# Patient Record
Sex: Female | Born: 1935 | Race: White | Hispanic: No | Marital: Married | Smoking: Never smoker
Health system: Southern US, Community
[De-identification: ages and names within clinical notes are randomized; demographics above are authoritative.]

## PROBLEM LIST (undated history)

## (undated) DIAGNOSIS — J849 Interstitial pulmonary disease, unspecified: Secondary | ICD-10-CM

## (undated) DIAGNOSIS — C801 Malignant (primary) neoplasm, unspecified: Secondary | ICD-10-CM

## (undated) DIAGNOSIS — K8020 Calculus of gallbladder without cholecystitis without obstruction: Secondary | ICD-10-CM

## (undated) DIAGNOSIS — G4700 Insomnia, unspecified: Secondary | ICD-10-CM

## (undated) DIAGNOSIS — N289 Disorder of kidney and ureter, unspecified: Secondary | ICD-10-CM

## (undated) DIAGNOSIS — J449 Chronic obstructive pulmonary disease, unspecified: Secondary | ICD-10-CM

## (undated) DIAGNOSIS — E559 Vitamin D deficiency, unspecified: Secondary | ICD-10-CM

## (undated) DIAGNOSIS — Z77090 Contact with and (suspected) exposure to asbestos: Secondary | ICD-10-CM

## (undated) DIAGNOSIS — I4510 Unspecified right bundle-branch block: Secondary | ICD-10-CM

## (undated) DIAGNOSIS — I491 Atrial premature depolarization: Secondary | ICD-10-CM

## (undated) DIAGNOSIS — I10 Essential (primary) hypertension: Secondary | ICD-10-CM

## (undated) DIAGNOSIS — E079 Disorder of thyroid, unspecified: Secondary | ICD-10-CM

## (undated) DIAGNOSIS — M8580 Other specified disorders of bone density and structure, unspecified site: Secondary | ICD-10-CM

## (undated) DIAGNOSIS — I639 Cerebral infarction, unspecified: Secondary | ICD-10-CM

## (undated) DIAGNOSIS — E119 Type 2 diabetes mellitus without complications: Secondary | ICD-10-CM

## (undated) HISTORY — PX: KNEE SURGERY: SHX244

## (undated) HISTORY — PX: CORONARY ANGIOPLASTY WITH STENT PLACEMENT: SHX49

## (undated) HISTORY — PX: VEIN SURGERY: SHX48

## (undated) HISTORY — PX: BREAST SURGERY: SHX581

---

## 1999-06-09 ENCOUNTER — Encounter: Payer: Self-pay | Admitting: Orthopedic Surgery

## 1999-06-13 ENCOUNTER — Inpatient Hospital Stay (HOSPITAL_COMMUNITY): Admission: RE | Admit: 1999-06-13 | Discharge: 1999-06-17 | Payer: Self-pay | Admitting: Orthopedic Surgery

## 1999-06-17 ENCOUNTER — Inpatient Hospital Stay (HOSPITAL_COMMUNITY)
Admission: RE | Admit: 1999-06-17 | Discharge: 1999-06-22 | Payer: Self-pay | Admitting: Physical Medicine and Rehabilitation

## 1999-06-23 ENCOUNTER — Encounter
Admission: RE | Admit: 1999-06-23 | Discharge: 1999-07-18 | Payer: Self-pay | Admitting: Physical Medicine and Rehabilitation

## 1999-07-19 ENCOUNTER — Encounter
Admission: RE | Admit: 1999-07-19 | Discharge: 1999-10-04 | Payer: Self-pay | Admitting: Physical Medicine and Rehabilitation

## 1999-08-26 ENCOUNTER — Ambulatory Visit (HOSPITAL_BASED_OUTPATIENT_CLINIC_OR_DEPARTMENT_OTHER): Admission: RE | Admit: 1999-08-26 | Discharge: 1999-08-26 | Payer: Self-pay | Admitting: Orthopedic Surgery

## 2004-10-19 ENCOUNTER — Ambulatory Visit: Payer: Self-pay | Admitting: Family Medicine

## 2004-10-19 ENCOUNTER — Other Ambulatory Visit: Admission: RE | Admit: 2004-10-19 | Discharge: 2004-10-19 | Payer: Self-pay | Admitting: Family Medicine

## 2005-03-21 ENCOUNTER — Ambulatory Visit: Payer: Self-pay | Admitting: Family Medicine

## 2010-04-04 ENCOUNTER — Encounter (INDEPENDENT_AMBULATORY_CARE_PROVIDER_SITE_OTHER): Payer: Self-pay | Admitting: *Deleted

## 2010-12-20 ENCOUNTER — Telehealth: Payer: Self-pay | Admitting: Gastroenterology

## 2011-01-17 NOTE — Letter (Signed)
Summary: Colonoscopy Letter  [facility]  [address] [place], [place] [address]   Phone: [phone]  Fax: [phone]      April 04, 2010 MRN: [id number]   [facility] [address] [place], [place]  [address]   Dear Ms. [facility],   According to your medical record, it is time for you to schedule a Colonoscopy. The American Cancer Society recommends this procedure as a method to detect early colon cancer. Patients with a family history of colon cancer, or a personal history of colon polyps or inflammatory bowel disease are at increased risk.  This letter has beeen generated based on the recommendations made at the time of your procedure. If you feel that in your particular situation this may no longer apply, please contact our office.  Please call our office at [phone] to schedule this appointment or to update your records at your earliest convenience.  Thank you for cooperating with [place] to provide you with the very best care possible.   Sincerely,  Barbette Hair. Arlyce Dice, M.D.  [facility] [phone]

## 2011-01-19 NOTE — Progress Notes (Signed)
Summary: Schedule Office Visit to Discuss Colonoscopy  Phone Note Outgoing Call Call back at [facility] Phone [phone]   Call placed by: Harlow Mares CMA Duncan Dull),  December 20, 2010 12:18 PM Call placed to: Patient Summary of Call: patient due for a colonoscopy said she never got the recall letter. She will need an office visit since she is 89. She states that she is going to see her primary care MD and will discuss this with him when she goes and call back to schedule. She states that she has our number.  Initial call taken by: Harlow Mares CMA (AAMA),  December 20, 2010 12:18 PM

## 2018-03-10 ENCOUNTER — Inpatient Hospital Stay (HOSPITAL_BASED_OUTPATIENT_CLINIC_OR_DEPARTMENT_OTHER)
Admission: EM | Admit: 2018-03-10 | Discharge: 2018-03-18 | DRG: 207 | Disposition: E | Payer: Medicare HMO | Attending: Pulmonary Disease | Admitting: Pulmonary Disease

## 2018-03-10 ENCOUNTER — Emergency Department (HOSPITAL_BASED_OUTPATIENT_CLINIC_OR_DEPARTMENT_OTHER): Payer: Medicare HMO

## 2018-03-10 ENCOUNTER — Other Ambulatory Visit: Payer: Self-pay

## 2018-03-10 ENCOUNTER — Encounter (HOSPITAL_BASED_OUTPATIENT_CLINIC_OR_DEPARTMENT_OTHER): Payer: Self-pay | Admitting: Emergency Medicine

## 2018-03-10 DIAGNOSIS — J449 Chronic obstructive pulmonary disease, unspecified: Secondary | ICD-10-CM | POA: Diagnosis present

## 2018-03-10 DIAGNOSIS — M8580 Other specified disorders of bone density and structure, unspecified site: Secondary | ICD-10-CM | POA: Diagnosis present

## 2018-03-10 DIAGNOSIS — J9602 Acute respiratory failure with hypercapnia: Secondary | ICD-10-CM | POA: Diagnosis not present

## 2018-03-10 DIAGNOSIS — Z8673 Personal history of transient ischemic attack (TIA), and cerebral infarction without residual deficits: Secondary | ICD-10-CM

## 2018-03-10 DIAGNOSIS — Z9289 Personal history of other medical treatment: Secondary | ICD-10-CM

## 2018-03-10 DIAGNOSIS — Z7951 Long term (current) use of inhaled steroids: Secondary | ICD-10-CM

## 2018-03-10 DIAGNOSIS — Z4659 Encounter for fitting and adjustment of other gastrointestinal appliance and device: Secondary | ICD-10-CM

## 2018-03-10 DIAGNOSIS — Z0189 Encounter for other specified special examinations: Secondary | ICD-10-CM

## 2018-03-10 DIAGNOSIS — Z66 Do not resuscitate: Secondary | ICD-10-CM | POA: Diagnosis not present

## 2018-03-10 DIAGNOSIS — I129 Hypertensive chronic kidney disease with stage 1 through stage 4 chronic kidney disease, or unspecified chronic kidney disease: Secondary | ICD-10-CM | POA: Diagnosis present

## 2018-03-10 DIAGNOSIS — E039 Hypothyroidism, unspecified: Secondary | ICD-10-CM | POA: Diagnosis present

## 2018-03-10 DIAGNOSIS — Z515 Encounter for palliative care: Secondary | ICD-10-CM | POA: Diagnosis not present

## 2018-03-10 DIAGNOSIS — I4510 Unspecified right bundle-branch block: Secondary | ICD-10-CM | POA: Diagnosis present

## 2018-03-10 DIAGNOSIS — R0602 Shortness of breath: Secondary | ICD-10-CM | POA: Diagnosis not present

## 2018-03-10 DIAGNOSIS — N183 Chronic kidney disease, stage 3 (moderate): Secondary | ICD-10-CM | POA: Diagnosis present

## 2018-03-10 DIAGNOSIS — I252 Old myocardial infarction: Secondary | ICD-10-CM

## 2018-03-10 DIAGNOSIS — I2510 Atherosclerotic heart disease of native coronary artery without angina pectoris: Secondary | ICD-10-CM

## 2018-03-10 DIAGNOSIS — Z888 Allergy status to other drugs, medicaments and biological substances status: Secondary | ICD-10-CM

## 2018-03-10 DIAGNOSIS — R0902 Hypoxemia: Secondary | ICD-10-CM | POA: Diagnosis present

## 2018-03-10 DIAGNOSIS — G8929 Other chronic pain: Secondary | ICD-10-CM | POA: Diagnosis present

## 2018-03-10 DIAGNOSIS — J181 Lobar pneumonia, unspecified organism: Secondary | ICD-10-CM | POA: Diagnosis not present

## 2018-03-10 DIAGNOSIS — I10 Essential (primary) hypertension: Secondary | ICD-10-CM | POA: Diagnosis present

## 2018-03-10 DIAGNOSIS — R0603 Acute respiratory distress: Secondary | ICD-10-CM

## 2018-03-10 DIAGNOSIS — J189 Pneumonia, unspecified organism: Secondary | ICD-10-CM

## 2018-03-10 DIAGNOSIS — Z9861 Coronary angioplasty status: Secondary | ICD-10-CM

## 2018-03-10 DIAGNOSIS — Z7982 Long term (current) use of aspirin: Secondary | ICD-10-CM

## 2018-03-10 DIAGNOSIS — Z7902 Long term (current) use of antithrombotics/antiplatelets: Secondary | ICD-10-CM

## 2018-03-10 DIAGNOSIS — E872 Acidosis: Secondary | ICD-10-CM | POA: Diagnosis present

## 2018-03-10 DIAGNOSIS — E1122 Type 2 diabetes mellitus with diabetic chronic kidney disease: Secondary | ICD-10-CM | POA: Diagnosis present

## 2018-03-10 DIAGNOSIS — G9341 Metabolic encephalopathy: Secondary | ICD-10-CM | POA: Diagnosis not present

## 2018-03-10 DIAGNOSIS — E559 Vitamin D deficiency, unspecified: Secondary | ICD-10-CM | POA: Diagnosis present

## 2018-03-10 DIAGNOSIS — Z77090 Contact with and (suspected) exposure to asbestos: Secondary | ICD-10-CM | POA: Diagnosis present

## 2018-03-10 DIAGNOSIS — J9601 Acute respiratory failure with hypoxia: Secondary | ICD-10-CM

## 2018-03-10 DIAGNOSIS — Z781 Physical restraint status: Secondary | ICD-10-CM

## 2018-03-10 DIAGNOSIS — D649 Anemia, unspecified: Secondary | ICD-10-CM | POA: Diagnosis present

## 2018-03-10 DIAGNOSIS — J849 Interstitial pulmonary disease, unspecified: Secondary | ICD-10-CM | POA: Diagnosis present

## 2018-03-10 DIAGNOSIS — M25552 Pain in left hip: Secondary | ICD-10-CM

## 2018-03-10 DIAGNOSIS — Z955 Presence of coronary angioplasty implant and graft: Secondary | ICD-10-CM

## 2018-03-10 DIAGNOSIS — Z885 Allergy status to narcotic agent status: Secondary | ICD-10-CM

## 2018-03-10 HISTORY — DX: Essential (primary) hypertension: I10

## 2018-03-10 HISTORY — DX: Calculus of gallbladder without cholecystitis without obstruction: K80.20

## 2018-03-10 HISTORY — DX: Chronic obstructive pulmonary disease, unspecified: J44.9

## 2018-03-10 HISTORY — DX: Other specified disorders of bone density and structure, unspecified site: M85.80

## 2018-03-10 HISTORY — DX: Interstitial pulmonary disease, unspecified: J84.9

## 2018-03-10 HISTORY — DX: Disorder of kidney and ureter, unspecified: N28.9

## 2018-03-10 HISTORY — DX: Type 2 diabetes mellitus without complications: E11.9

## 2018-03-10 HISTORY — DX: Cerebral infarction, unspecified: I63.9

## 2018-03-10 HISTORY — DX: Vitamin D deficiency, unspecified: E55.9

## 2018-03-10 HISTORY — DX: Atrial premature depolarization: I49.1

## 2018-03-10 HISTORY — DX: Disorder of thyroid, unspecified: E07.9

## 2018-03-10 HISTORY — DX: Malignant (primary) neoplasm, unspecified: C80.1

## 2018-03-10 HISTORY — DX: Contact with and (suspected) exposure to asbestos: Z77.090

## 2018-03-10 HISTORY — DX: Insomnia, unspecified: G47.00

## 2018-03-10 HISTORY — DX: Unspecified right bundle-branch block: I45.10

## 2018-03-10 LAB — CBC WITH DIFFERENTIAL/PLATELET
BASOS ABS: 0 10*3/uL (ref 0.0–0.1)
BASOS PCT: 0 %
EOS ABS: 0.4 10*3/uL (ref 0.0–0.7)
EOS PCT: 4 %
HEMATOCRIT: 26.8 % — AB (ref 36.0–46.0)
Hemoglobin: 7.8 g/dL — ABNORMAL LOW (ref 12.0–15.0)
Lymphocytes Relative: 18 %
Lymphs Abs: 1.5 10*3/uL (ref 0.7–4.0)
MCH: 26.7 pg (ref 26.0–34.0)
MCHC: 29.1 g/dL — AB (ref 30.0–36.0)
MCV: 91.8 fL (ref 78.0–100.0)
MONO ABS: 1.1 10*3/uL — AB (ref 0.1–1.0)
MONOS PCT: 12 %
NEUTROS ABS: 5.6 10*3/uL (ref 1.7–7.7)
Neutrophils Relative %: 66 %
PLATELETS: 287 10*3/uL (ref 150–400)
RBC: 2.92 MIL/uL — ABNORMAL LOW (ref 3.87–5.11)
RDW: 17.3 % — AB (ref 11.5–15.5)
WBC: 8.6 10*3/uL (ref 4.0–10.5)

## 2018-03-10 LAB — COMPREHENSIVE METABOLIC PANEL
ALBUMIN: 3.4 g/dL — AB (ref 3.5–5.0)
ALK PHOS: 56 U/L (ref 38–126)
ALT: 17 U/L (ref 14–54)
AST: 30 U/L (ref 15–41)
Anion gap: 10 (ref 5–15)
BILIRUBIN TOTAL: 0.4 mg/dL (ref 0.3–1.2)
BUN: 32 mg/dL — AB (ref 6–20)
CALCIUM: 8.3 mg/dL — AB (ref 8.9–10.3)
CO2: 27 mmol/L (ref 22–32)
CREATININE: 1.09 mg/dL — AB (ref 0.44–1.00)
Chloride: 102 mmol/L (ref 101–111)
GFR calc Af Amer: 51 mL/min — ABNORMAL LOW (ref >60)
GFR calc non Af Amer: 44 mL/min — ABNORMAL LOW (ref >60)
GLUCOSE: 113 mg/dL — AB (ref 65–99)
Potassium: 4.1 mmol/L (ref 3.5–5.1)
SODIUM: 139 mmol/L (ref 135–145)
TOTAL PROTEIN: 7.5 g/dL (ref 6.5–8.1)

## 2018-03-10 LAB — OCCULT BLOOD X 1 CARD TO LAB, STOOL: FECAL OCCULT BLD: NEGATIVE

## 2018-03-10 LAB — TROPONIN I: Troponin I: <0.03 ng/mL (ref <0.03)

## 2018-03-10 MED ORDER — FENTANYL CITRATE (PF) 100 MCG/2ML IJ SOLN
50.0000 ug | Freq: Once | INTRAMUSCULAR | Status: AC
  Administered 2018-03-10: 50 ug via INTRAVENOUS
  Filled 2018-03-10: qty 2

## 2018-03-10 MED ORDER — IPRATROPIUM-ALBUTEROL 0.5-2.5 (3) MG/3ML IN SOLN
3.0000 mL | Freq: Once | RESPIRATORY_TRACT | Status: AC
  Administered 2018-03-10: 3 mL via RESPIRATORY_TRACT
  Filled 2018-03-10: qty 3

## 2018-03-10 MED ORDER — DIAZEPAM 5 MG/ML IJ SOLN
2.5000 mg | Freq: Once | INTRAMUSCULAR | Status: AC
Start: 2018-03-11 — End: 2018-03-11
  Administered 2018-03-11: 2.5 mg via INTRAVENOUS
  Filled 2018-03-10: qty 2

## 2018-03-10 MED ORDER — ALBUTEROL SULFATE (2.5 MG/3ML) 0.083% IN NEBU
2.5000 mg | INHALATION_SOLUTION | Freq: Once | RESPIRATORY_TRACT | Status: AC
  Administered 2018-03-10: 2.5 mg via RESPIRATORY_TRACT
  Filled 2018-03-10: qty 3

## 2018-03-10 NOTE — ED Notes (Signed)
ED Provider at bedside speaking with family. Pt remains in xray

## 2018-03-10 NOTE — ED Notes (Signed)
ED Provider at bedside. Dr. Long 

## 2018-03-10 NOTE — ED Provider Notes (Signed)
Emergency Department Provider Note   I have reviewed the triage vital signs and the nursing notes.   HISTORY  Chief Complaint Shortness of Breath   HPI Melissa Briggs is a 84 y.o. female with PMH of COPD, HTN, prior CVA, CKD, and interstitial lung disease on 2L [place] presents to the emergency department for evaluation of worsening lower extremity swelling and chronic left hip pain.  The patient states that her difficulty breathing is near her baseline and is not the reason she presented initially.  She denies any chest pain.  In speaking with her husband at bedside and daughter by phone the patient has had chronic difficulty breathing over the last year and they agree it is not significantly worse from baseline.  They note worsening swelling in the legs and pain in the left hip.  She has followed with her primary care physician and apparently had x-rays of the hip with no acute findings.  Approximately 10 days ago she saw her primary care physician who started her on gabapentin but she had side effects and did not tolerate the medication.  The daughter, by phone, states that she recently lived with the patient and that she is frequently up at night and sleeping during the day.  She notes memory issues and thinks many of these problems are coming from lack of sleep.    Past Medical History:  Diagnosis Date  . Asbestos exposure   . Atrial premature depolarization   . Cancer ([place])   . COPD (chronic obstructive pulmonary disease) ([place])   . Diabetes mellitus without complication ([place])   . Gallstones   . Hypertension    essential  . Insomnia   . Interstitial lung disease ([place])   . Osteopenia   . RBBB   . Renal disorder    stage 3 kidney disease  . Stroke ([place])    tia  . Thyroid disease    acquired hypothyroidism  . Vitamin D deficiency     There are no active problems to display for this patient.   Past Surgical History:  Procedure Laterality Date  . BREAST SURGERY    .  CORONARY ANGIOPLASTY WITH STENT PLACEMENT    . KNEE SURGERY    . VEIN SURGERY      Current Outpatient Rx  . Order #: [id number] Class: Historical Med  . Order #: [id number] Class: Historical Med  . Order #: [id number] Class: Historical Med  . Order #: [id number] Class: Historical Med  . Order #: [id number] Class: Historical Med  . Order #: [id number] Class: Historical Med  . Order #: [id number] Class: Historical Med    Allergies Celebrex [celecoxib]; Codeine; and Pravachol [pravastatin sodium]  History reviewed. No pertinent family history.  Social History Social History   Tobacco Use  . Smoking status: Never Smoker  . Smokeless tobacco: Never Used  Substance Use Topics  . Alcohol use: Not Currently  . Drug use: Not Currently    Review of Systems  Constitutional: No fever/chills. Positive generalized weakness.  Eyes: No visual changes. ENT: No sore throat. Cardiovascular: Denies chest pain. Positive LE edema.  Respiratory: Positive shortness of breath. Gastrointestinal: No abdominal pain.  No nausea, no vomiting.  No diarrhea.  No constipation. Genitourinary: Negative for dysuria. Musculoskeletal: Negative for back pain. Positive left hip pain.  Skin: Negative for rash. Neurological: Negative for headaches, focal weakness or numbness.  10-point ROS otherwise negative.  ____________________________________________   PHYSICAL EXAM:  VITAL SIGNS: ED Triage Vitals  Enc Vitals Group  BP 02/23/2018 2202 (!) 147/82     Pulse Rate 02/16/2018 2202 76     Resp 03/15/2018 2202 18     Temp 03/16/2018 2202 98.6 F (37 C)     Temp Source 02/25/2018 2202 Oral     SpO2 03/03/2018 2202 96 %     Weight 03/13/2018 2152 195 lb (88.5 kg)     Height 02/17/2018 2152 5\' 3"  (1.6 m)     Pain Score 02/24/2018 2151 0   Constitutional: Alert with rapid breathing. Appears to be in moderate distress.  Eyes: Conjunctivae are normal. Head: Atraumatic. Nose: No congestion/rhinnorhea. Mouth/Throat: Mucous membranes are  slightly dry.  Neck: No stridor.   Cardiovascular: Normal rate, regular rhythm. Good peripheral circulation. Grossly normal heart sounds.   Respiratory: Increased respiratory effort.  No retractions. Lungs diminished at the bases and with end-expiratory wheezing.  Gastrointestinal: Soft and nontender. No distention. Brown stool. No BRB or melena.  Musculoskeletal: Bilateral LE edema that is pitting and above the knee bilaterally. Chronic appearing skin redness in both legs.  Neurologic:  Normal speech and language. No gross focal neurologic deficits are appreciated.  Skin:  Skin is warm and dry.   ____________________________________________   LABS (all labs ordered are listed, but only abnormal results are displayed)  Labs Reviewed  COMPREHENSIVE METABOLIC PANEL - Abnormal; Notable for the following components:      Result Value   Glucose, Bld 113 (*)    BUN 32 (*)    Creatinine, Ser 1.09 (*)    Calcium 8.3 (*)    Albumin 3.4 (*)    GFR calc non Af Amer 44 (*)    GFR calc Af Amer 51 (*)    All other components within normal limits  BRAIN NATRIURETIC PEPTIDE - Abnormal; Notable for the following components:   B Natriuretic Peptide 195.3 (*)    All other components within normal limits  CBC WITH DIFFERENTIAL/PLATELET - Abnormal; Notable for the following components:   RBC 2.92 (*)    Hemoglobin 7.8 (*)    HCT 26.8 (*)    MCHC 29.1 (*)    RDW 17.3 (*)    Monocytes Absolute 1.1 (*)    All other components within normal limits  URINE CULTURE  TROPONIN I  OCCULT BLOOD X 1 CARD TO LAB, STOOL  URINALYSIS, ROUTINE W REFLEX MICROSCOPIC   ____________________________________________  EKG   EKG Interpretation  Date/Time:  Sunday March 10 2018 21:59:36 EDT Ventricular Rate:  82 PR Interval:  172 QRS Duration: 122 QT Interval:  410 QTC Calculation: 479 R Axis:   39 Text Interpretation:  Normal sinus rhythm Right bundle branch block Abnormal ECG No STEMI.  No recent EKG for  comparison.  Confirmed by Nanda Quinton [phone]) on 03/09/2018 10:17:58 PM       ____________________________________________  RADIOLOGY  Dg Chest 2 View  Result Date: 03/11/2018 CLINICAL DATA:  84 year old female with shortness of breath for 2 days. Lower extremity swelling. EXAM: CHEST - 2 VIEW COMPARISON:  01/04/2018 and earlier. FINDINGS: Chronic exaggerated thoracic kyphosis and increased AP dimension to the chest. Stable cardiomegaly and mediastinal contours. Calcified aortic atherosclerosis. No pneumothorax, pulmonary edema, pleural effusion or acute pulmonary opacity identified. Osteopenia. No acute osseous abnormality identified. Negative visible bowel gas pattern. Chronic postoperative changes to the right chest wall/axilla. IMPRESSION: 1.  No acute cardiopulmonary abnormality. 2. Chronic cardiomegaly, Calcified aortic atherosclerosis. Electronically Signed   By: Genevie Ann M.D.   On: 03/11/2018 00:04   Dg Hip  Unilat W Or Wo Pelvis 2-3 Views Left  Result Date: 03/11/2018 CLINICAL DATA:  Bilateral lower extremity swelling and hip pain EXAM: DG HIP (WITH OR WITHOUT PELVIS) 2-3V LEFT COMPARISON:  None. FINDINGS: There is no evidence of hip fracture or dislocation. There is no evidence of arthropathy or other focal bone abnormality. IMPRESSION: Negative. Electronically Signed   By: Ulyses Jarred M.D.   On: 03/11/2018 00:08    ____________________________________________   PROCEDURES  Procedure(s) performed:   .Critical Care Performed by: Margette Fast, MD Authorized by: Margette Fast, MD   Critical care provider statement:    Critical care time (minutes):  35   Critical care time was exclusive of:  Separately billable procedures and treating other patients and teaching time   Critical care was necessary to treat or prevent imminent or life-threatening deterioration of the following conditions:  Respiratory failure and circulatory failure   Critical care was time spent personally by  me on the following activities:  Blood draw for specimens, development of treatment plan with patient or surrogate, evaluation of patient's response to treatment, examination of patient, obtaining history from patient or surrogate, ordering and performing treatments and interventions, ordering and review of laboratory studies, ordering and review of radiographic studies, pulse oximetry, re-evaluation of patient's condition and review of old charts   I assumed direction of critical care for this patient from another provider in my specialty: no      ____________________________________________   INITIAL IMPRESSION / ASSESSMENT AND PLAN / ED COURSE  Pertinent labs & imaging results that were available during my care of the patient were reviewed by me and considered in my medical decision making (see chart for details).  Patient presents to the emergency department for evaluation of increasing lower extremity swelling over the past 2 days.  She denies worsening shortness of breath to me or chest pain.  Husband states that her shortness of breath seems about where it normally is.  She does have edematous lower extremities bilaterally with skin changes consistent with chronic lower extremity edema.  She is complaining of left hip pain.  Plan to obtain chest x-ray, hip x-ray, labs, and reassess. Neb given on arrival with some improvement in SOB.   Labs reviewed with worsening anemia. 7.8 today down from 12.2 on 01/01/2018 in [place]. Suspect that this is contributing to the patient's SOB and weakness at home. Plain films of the chest and hip are negative. Patient given Valium for spasm type pain. Called transfer line for admission to [facility] but no beds until at least 12 tomorrow with 15 waiting in the ED. Will admit to [facility] for transfusion, anemia w/u, and mgmt of peripheral edema.   Discussed patient's case with Hospitalist, Dr. Aggie Moats to request admission. Patient and family (if  present) updated with plan. Care transferred to [facility] service.  I reviewed all nursing notes, vitals, pertinent old records, EKGs, labs, imaging (as available).  ____________________________________________  FINAL CLINICAL IMPRESSION(S) / ED DIAGNOSES  Final diagnoses:  Symptomatic anemia  Left hip pain  SOB (shortness of breath)     MEDICATIONS GIVEN DURING THIS VISIT:  Medications  ipratropium-albuterol (DUONEB) 0.5-2.5 (3) MG/3ML nebulizer solution 3 mL (3 mLs Nebulization Given 03/09/2018 2223)  albuterol (PROVENTIL) (2.5 MG/3ML) 0.083% nebulizer solution 2.5 mg (2.5 mg Nebulization Given 02/20/2018 2223)  fentaNYL (SUBLIMAZE) injection 50 mcg (50 mcg Intravenous Given 02/17/2018 2343)  diazepam (VALIUM) injection 2.5 mg (2.5 mg Intravenous Given 03/11/18 0009)  0.9 %  sodium chloride infusion ( Intravenous New Bag/Given 03/11/18 0008)    Note:  This document was prepared using Dragon voice recognition software and may include unintentional dictation errors.  Nanda Quinton, MD Emergency Medicine    Estefany Goebel, [facility], MD 03/11/18 1026

## 2018-03-10 NOTE — ED Notes (Signed)
RT at bedside giving pt breathing tx

## 2018-03-10 NOTE — ED Notes (Signed)
Patient transported to X-ray 

## 2018-03-10 NOTE — ED Triage Notes (Signed)
Patient states that she had had increased swelling to her bilateral lower extremities worsening over the last 2 days - also increased SOB - patient is on home O2 at 2 liters

## 2018-03-10 NOTE — ED Notes (Signed)
ED Provider at bedside. 

## 2018-03-11 ENCOUNTER — Observation Stay (HOSPITAL_COMMUNITY): Payer: Medicare HMO

## 2018-03-11 ENCOUNTER — Inpatient Hospital Stay (HOSPITAL_COMMUNITY): Payer: Medicare HMO

## 2018-03-11 ENCOUNTER — Observation Stay (HOSPITAL_COMMUNITY)
Admit: 2018-03-11 | Discharge: 2018-03-11 | Disposition: A | Discharge status: Home / Self Care | Payer: Medicare HMO | Attending: Internal Medicine | Admitting: Internal Medicine

## 2018-03-11 DIAGNOSIS — E1122 Type 2 diabetes mellitus with diabetic chronic kidney disease: Secondary | ICD-10-CM | POA: Diagnosis present

## 2018-03-11 DIAGNOSIS — R0602 Shortness of breath: Secondary | ICD-10-CM | POA: Diagnosis present

## 2018-03-11 DIAGNOSIS — E559 Vitamin D deficiency, unspecified: Secondary | ICD-10-CM | POA: Diagnosis present

## 2018-03-11 DIAGNOSIS — I4510 Unspecified right bundle-branch block: Secondary | ICD-10-CM | POA: Diagnosis present

## 2018-03-11 DIAGNOSIS — A419 Sepsis, unspecified organism: Secondary | ICD-10-CM | POA: Diagnosis not present

## 2018-03-11 DIAGNOSIS — Z7189 Other specified counseling: Secondary | ICD-10-CM

## 2018-03-11 DIAGNOSIS — G9341 Metabolic encephalopathy: Secondary | ICD-10-CM | POA: Diagnosis not present

## 2018-03-11 DIAGNOSIS — N183 Chronic kidney disease, stage 3 (moderate): Secondary | ICD-10-CM | POA: Diagnosis present

## 2018-03-11 DIAGNOSIS — J9601 Acute respiratory failure with hypoxia: Secondary | ICD-10-CM

## 2018-03-11 DIAGNOSIS — I2510 Atherosclerotic heart disease of native coronary artery without angina pectoris: Secondary | ICD-10-CM | POA: Diagnosis present

## 2018-03-11 DIAGNOSIS — Z885 Allergy status to narcotic agent status: Secondary | ICD-10-CM | POA: Diagnosis not present

## 2018-03-11 DIAGNOSIS — Z515 Encounter for palliative care: Secondary | ICD-10-CM | POA: Diagnosis not present

## 2018-03-11 DIAGNOSIS — Z9861 Coronary angioplasty status: Secondary | ICD-10-CM | POA: Diagnosis not present

## 2018-03-11 DIAGNOSIS — G9340 Encephalopathy, unspecified: Secondary | ICD-10-CM | POA: Diagnosis not present

## 2018-03-11 DIAGNOSIS — R6521 Severe sepsis with septic shock: Secondary | ICD-10-CM | POA: Diagnosis not present

## 2018-03-11 DIAGNOSIS — J9602 Acute respiratory failure with hypercapnia: Secondary | ICD-10-CM | POA: Diagnosis not present

## 2018-03-11 DIAGNOSIS — J181 Lobar pneumonia, unspecified organism: Principal | ICD-10-CM

## 2018-03-11 DIAGNOSIS — I509 Heart failure, unspecified: Secondary | ICD-10-CM | POA: Diagnosis not present

## 2018-03-11 DIAGNOSIS — Z8673 Personal history of transient ischemic attack (TIA), and cerebral infarction without residual deficits: Secondary | ICD-10-CM | POA: Diagnosis not present

## 2018-03-11 DIAGNOSIS — J449 Chronic obstructive pulmonary disease, unspecified: Secondary | ICD-10-CM | POA: Diagnosis present

## 2018-03-11 DIAGNOSIS — I252 Old myocardial infarction: Secondary | ICD-10-CM | POA: Diagnosis not present

## 2018-03-11 DIAGNOSIS — M8580 Other specified disorders of bone density and structure, unspecified site: Secondary | ICD-10-CM | POA: Diagnosis present

## 2018-03-11 DIAGNOSIS — I10 Essential (primary) hypertension: Secondary | ICD-10-CM | POA: Diagnosis present

## 2018-03-11 DIAGNOSIS — E039 Hypothyroidism, unspecified: Secondary | ICD-10-CM | POA: Diagnosis present

## 2018-03-11 DIAGNOSIS — I129 Hypertensive chronic kidney disease with stage 1 through stage 4 chronic kidney disease, or unspecified chronic kidney disease: Secondary | ICD-10-CM | POA: Diagnosis present

## 2018-03-11 DIAGNOSIS — R0902 Hypoxemia: Secondary | ICD-10-CM | POA: Diagnosis present

## 2018-03-11 DIAGNOSIS — J849 Interstitial pulmonary disease, unspecified: Secondary | ICD-10-CM | POA: Diagnosis present

## 2018-03-11 DIAGNOSIS — E872 Acidosis: Secondary | ICD-10-CM | POA: Diagnosis present

## 2018-03-11 DIAGNOSIS — M25552 Pain in left hip: Secondary | ICD-10-CM | POA: Diagnosis present

## 2018-03-11 DIAGNOSIS — D649 Anemia, unspecified: Secondary | ICD-10-CM | POA: Diagnosis not present

## 2018-03-11 DIAGNOSIS — Z888 Allergy status to other drugs, medicaments and biological substances status: Secondary | ICD-10-CM | POA: Diagnosis not present

## 2018-03-11 DIAGNOSIS — Z955 Presence of coronary angioplasty implant and graft: Secondary | ICD-10-CM | POA: Diagnosis not present

## 2018-03-11 DIAGNOSIS — G8929 Other chronic pain: Secondary | ICD-10-CM | POA: Diagnosis present

## 2018-03-11 DIAGNOSIS — J189 Pneumonia, unspecified organism: Secondary | ICD-10-CM

## 2018-03-11 DIAGNOSIS — R609 Edema, unspecified: Secondary | ICD-10-CM | POA: Diagnosis not present

## 2018-03-11 DIAGNOSIS — Z77090 Contact with and (suspected) exposure to asbestos: Secondary | ICD-10-CM | POA: Diagnosis present

## 2018-03-11 LAB — BLOOD GAS, ARTERIAL
ACID-BASE EXCESS: 2.5 mmol/L — AB (ref 0.0–2.0)
Acid-base deficit: 0.5 mmol/L (ref 0.0–2.0)
Acid-base deficit: 0.3 mmol/L (ref 0.0–2.0)
BICARBONATE: 29.2 mmol/L — AB (ref 20.0–28.0)
BICARBONATE: 25.6 mmol/L (ref 20.0–28.0)
Bicarbonate: 28.7 mmol/L — ABNORMAL HIGH (ref 20.0–28.0)
DRAWN BY: 308601
DRAWN BY: 441261
Delivery systems: POSITIVE
Delivery systems: POSITIVE
Drawn by: 441261
Expiratory PAP: 5
Expiratory PAP: 5
FIO2: 75
FIO2: 50
FIO2: 100
INSPIRATORY PAP: 10
Inspiratory PAP: 15
LHR: 10 {breaths}/min
MECHVT: 420 mL
O2 SAT: 95.8 %
O2 Saturation: 96.6 %
O2 Saturation: 99.7 %
PCO2 ART: 81.6 mmHg — AB (ref 32.0–48.0)
PCO2 ART: 53.3 mmHg — AB (ref 32.0–48.0)
PEEP: 5 cmH2O
PH ART: 7.172 — AB (ref 7.350–7.450)
PO2 ART: 350 mmHg — AB (ref 83.0–108.0)
Patient temperature: 98.6
Patient temperature: 98.6
Patient temperature: 98.6
RATE: 18 resp/min
pCO2 arterial: 62.8 mmHg — ABNORMAL HIGH (ref 32.0–48.0)
pH, Arterial: 7.289 — ABNORMAL LOW (ref 7.350–7.450)
pH, Arterial: 7.303 — ABNORMAL LOW (ref 7.350–7.450)
pO2, Arterial: 110 mmHg — ABNORMAL HIGH (ref 83.0–108.0)
pO2, Arterial: 101 mmHg (ref 83.0–108.0)

## 2018-03-11 LAB — PROCALCITONIN: Procalcitonin: 0.1 ng/mL

## 2018-03-11 LAB — BASIC METABOLIC PANEL
Anion gap: 10 (ref 5–15)
BUN: 27 mg/dL — AB (ref 6–20)
CALCIUM: 8 mg/dL — AB (ref 8.9–10.3)
CHLORIDE: 102 mmol/L (ref 101–111)
CO2: 26 mmol/L (ref 22–32)
Creatinine, Ser: 0.8 mg/dL (ref 0.44–1.00)
GFR calc non Af Amer: >60 mL/min (ref >60)
Glucose, Bld: 142 mg/dL — ABNORMAL HIGH (ref 65–99)
Potassium: 4.3 mmol/L (ref 3.5–5.1)
SODIUM: 138 mmol/L (ref 135–145)

## 2018-03-11 LAB — TROPONIN I
TROPONIN I: 0.41 ng/mL — AB (ref <0.03)
Troponin I: 0.92 ng/mL (ref <0.03)

## 2018-03-11 LAB — URINALYSIS, ROUTINE W REFLEX MICROSCOPIC
BACTERIA UA: NONE SEEN
Bilirubin Urine: NEGATIVE
Glucose, UA: NEGATIVE mg/dL
Hgb urine dipstick: NEGATIVE
KETONES UR: NEGATIVE mg/dL
Nitrite: NEGATIVE
PROTEIN: NEGATIVE mg/dL
Specific Gravity, Urine: 1.017 (ref 1.005–1.030)
pH: 5 (ref 5.0–8.0)

## 2018-03-11 LAB — RETICULOCYTES
RBC.: 2.78 MIL/uL — ABNORMAL LOW (ref 3.87–5.11)
Retic Count, Absolute: 61.2 10*3/uL (ref 19.0–186.0)
Retic Ct Pct: 2.2 % (ref 0.4–3.1)

## 2018-03-11 LAB — VITAMIN B12: VITAMIN B 12: 260 pg/mL (ref 180–914)

## 2018-03-11 LAB — ECHOCARDIOGRAM COMPLETE
HEIGHTINCHES: 63 in
WEIGHTICAEL: 3120 [oz_av]

## 2018-03-11 LAB — MRSA PCR SCREENING: MRSA BY PCR: NEGATIVE

## 2018-03-11 LAB — CBG MONITORING, ED: Glucose-Capillary: 112 mg/dL — ABNORMAL HIGH (ref 65–99)

## 2018-03-11 LAB — BRAIN NATRIURETIC PEPTIDE
B Natriuretic Peptide: 195.3 pg/mL — ABNORMAL HIGH (ref 0.0–100.0)
B Natriuretic Peptide: 294.3 pg/mL — ABNORMAL HIGH (ref 0.0–100.0)

## 2018-03-11 LAB — HEMOGLOBIN AND HEMATOCRIT, BLOOD
HEMATOCRIT: 25.6 % — AB (ref 36.0–46.0)
Hemoglobin: 7.6 g/dL — ABNORMAL LOW (ref 12.0–15.0)

## 2018-03-11 LAB — IRON AND TIBC
IRON: 25 ug/dL — AB (ref 28–170)
SATURATION RATIOS: 7 % — AB (ref 10.4–31.8)
TIBC: 336 ug/dL (ref 250–450)
UIBC: 311 ug/dL

## 2018-03-11 LAB — FERRITIN: Ferritin: 24 ng/mL (ref 11–307)

## 2018-03-11 LAB — ABO/RH: ABO/RH(D): A POS

## 2018-03-11 LAB — PREPARE RBC (CROSSMATCH)

## 2018-03-11 LAB — FOLATE: FOLATE: 9.4 ng/mL (ref >5.9)

## 2018-03-11 LAB — HIV ANTIBODY (ROUTINE TESTING W REFLEX): HIV Screen 4th Generation wRfx: NONREACTIVE

## 2018-03-11 MED ORDER — FENTANYL CITRATE (PF) 100 MCG/2ML IJ SOLN
50.0000 ug | Freq: Once | INTRAMUSCULAR | Status: AC
  Administered 2018-03-11: 50 ug via INTRAVENOUS
  Filled 2018-03-11: qty 2

## 2018-03-11 MED ORDER — ROCURONIUM BROMIDE 50 MG/5ML IV SOLN
5.0000 mg | Freq: Once | INTRAVENOUS | Status: AC
  Administered 2018-03-11: 5 mg via INTRAVENOUS

## 2018-03-11 MED ORDER — SODIUM CHLORIDE 0.9 % IV SOLN
1.0000 g | INTRAVENOUS | Status: AC
  Administered 2018-03-11 – 2018-03-17 (×7): 1 g via INTRAVENOUS
  Filled 2018-03-11 (×7): qty 1

## 2018-03-11 MED ORDER — FENTANYL 2500MCG IN NS 250ML (10MCG/ML) PREMIX INFUSION
25.0000 ug/h | INTRAVENOUS | Status: DC
  Administered 2018-03-11 (×2): 200 ug/h via INTRAVENOUS
  Administered 2018-03-12: 300 ug/h via INTRAVENOUS
  Administered 2018-03-12: 250 ug/h via INTRAVENOUS
  Administered 2018-03-13: 150 ug/h via INTRAVENOUS
  Administered 2018-03-13: 300 ug/h via INTRAVENOUS
  Administered 2018-03-14: 100 ug/h via INTRAVENOUS
  Filled 2018-03-11 (×10): qty 250

## 2018-03-11 MED ORDER — NOREPINEPHRINE 4 MG/250ML-% IV SOLN
0.0000 ug/min | INTRAVENOUS | Status: DC
  Administered 2018-03-11: 10 ug/min via INTRAVENOUS
  Filled 2018-03-11: qty 250

## 2018-03-11 MED ORDER — FLUTICASONE FUROATE-VILANTEROL 100-25 MCG/INH IN AEPB
1.0000 | INHALATION_SPRAY | Freq: Every day | RESPIRATORY_TRACT | Status: DC
  Filled 2018-03-11: qty 28

## 2018-03-11 MED ORDER — ORAL CARE MOUTH RINSE
15.0000 mL | Freq: Four times a day (QID) | OROMUCOSAL | Status: DC
  Administered 2018-03-11 – 2018-03-17 (×24): 15 mL via OROMUCOSAL

## 2018-03-11 MED ORDER — SODIUM CHLORIDE 0.9 % IV SOLN
Freq: Once | INTRAVENOUS | Status: AC
  Administered 2018-03-11: via INTRAVENOUS

## 2018-03-11 MED ORDER — PANTOPRAZOLE SODIUM 40 MG PO PACK
40.0000 mg | PACK | Freq: Every day | ORAL | Status: DC
Start: 2018-03-11 — End: 2018-03-17
  Administered 2018-03-11 – 2018-03-16 (×6): 40 mg
  Filled 2018-03-11 (×6): qty 20

## 2018-03-11 MED ORDER — ALBUTEROL SULFATE (2.5 MG/3ML) 0.083% IN NEBU
3.0000 mL | INHALATION_SOLUTION | Freq: Four times a day (QID) | RESPIRATORY_TRACT | Status: DC | PRN
  Administered 2018-03-11: 3 mL via RESPIRATORY_TRACT
  Filled 2018-03-11: qty 3

## 2018-03-11 MED ORDER — FUROSEMIDE 10 MG/ML IJ SOLN: 40.0000 mg | Freq: Once | INTRAMUSCULAR | Status: DC

## 2018-03-11 MED ORDER — CARVEDILOL 3.125 MG PO TABS
3.1250 mg | ORAL_TABLET | Freq: Two times a day (BID) | ORAL | Status: DC
  Administered 2018-03-12 – 2018-03-13 (×2): 3.125 mg via ORAL
  Filled 2018-03-11 (×3): qty 1

## 2018-03-11 MED ORDER — DEXTROSE 5 % IV SOLN
0.0000 ug/min | INTRAVENOUS | Status: DC
  Filled 2018-03-11: qty 4

## 2018-03-11 MED ORDER — ATORVASTATIN CALCIUM 40 MG PO TABS
80.0000 mg | ORAL_TABLET | Freq: Every day | ORAL | Status: DC
  Administered 2018-03-11 – 2018-03-12 (×2): 80 mg via ORAL
  Filled 2018-03-11 (×2): qty 2

## 2018-03-11 MED ORDER — ONDANSETRON HCL 4 MG PO TABS: 4.0000 mg | ORAL_TABLET | Freq: Four times a day (QID) | ORAL | Status: DC | PRN

## 2018-03-11 MED ORDER — MIDAZOLAM HCL 2 MG/2ML IJ SOLN
INTRAMUSCULAR | Status: AC
  Administered 2018-03-11: 2 mg
  Filled 2018-03-11: qty 4

## 2018-03-11 MED ORDER — IOPAMIDOL (ISOVUE-370) INJECTION 76%
INTRAVENOUS | Status: AC
  Administered 2018-03-11: 100 mL
  Filled 2018-03-11: qty 100

## 2018-03-11 MED ORDER — AZITHROMYCIN 500 MG IV SOLR
500.0000 mg | INTRAVENOUS | Status: DC
  Administered 2018-03-11 – 2018-03-14 (×4): 500 mg via INTRAVENOUS
  Filled 2018-03-11 (×4): qty 500

## 2018-03-11 MED ORDER — ASPIRIN EC 81 MG PO TBEC
81.0000 mg | DELAYED_RELEASE_TABLET | Freq: Every day | ORAL | Status: DC
  Administered 2018-03-12: 81 mg via ORAL
  Filled 2018-03-11: qty 1

## 2018-03-11 MED ORDER — ACETAMINOPHEN 650 MG RE SUPP: 650.0000 mg | Freq: Once | RECTAL | Status: DC

## 2018-03-11 MED ORDER — ETOMIDATE 2 MG/ML IV SOLN
20.0000 mg | Freq: Once | INTRAVENOUS | Status: AC
  Administered 2018-03-11: 20 mg via INTRAVENOUS

## 2018-03-11 MED ORDER — CHLORHEXIDINE GLUCONATE 0.12% ORAL RINSE (MEDLINE KIT)
15.0000 mL | Freq: Two times a day (BID) | OROMUCOSAL | Status: DC
  Administered 2018-03-11 – 2018-03-16 (×12): 15 mL via OROMUCOSAL

## 2018-03-11 MED ORDER — ONDANSETRON HCL 4 MG/2ML IJ SOLN: 4.0000 mg | Freq: Four times a day (QID) | INTRAMUSCULAR | Status: DC | PRN

## 2018-03-11 MED ORDER — ACETAMINOPHEN 325 MG PO TABS
650.0000 mg | ORAL_TABLET | Freq: Four times a day (QID) | ORAL | Status: DC | PRN
  Administered 2018-03-14: 650 mg via ORAL
  Filled 2018-03-11: qty 2

## 2018-03-11 MED ORDER — LISINOPRIL 2.5 MG PO TABS
5.0000 mg | ORAL_TABLET | Freq: Every day | ORAL | Status: DC
  Administered 2018-03-12 – 2018-03-16 (×5): 5 mg via ORAL
  Filled 2018-03-11 (×5): qty 2

## 2018-03-11 MED ORDER — IPRATROPIUM-ALBUTEROL 0.5-2.5 (3) MG/3ML IN SOLN
3.0000 mL | Freq: Four times a day (QID) | RESPIRATORY_TRACT | Status: DC
  Administered 2018-03-11 – 2018-03-17 (×25): 3 mL via RESPIRATORY_TRACT
  Filled 2018-03-11 (×24): qty 3

## 2018-03-11 MED ORDER — NOREPINEPHRINE BITARTRATE 1 MG/ML IV SOLN: 0.0000 ug/min | INTRAVENOUS | Status: DC

## 2018-03-11 MED ORDER — FENTANYL BOLUS VIA INFUSION
25.0000 ug | INTRAVENOUS | Status: DC | PRN
  Administered 2018-03-11: 50 ug via INTRAVENOUS
  Administered 2018-03-14: 25 ug via INTRAVENOUS
  Filled 2018-03-11: qty 25

## 2018-03-11 MED ORDER — TICAGRELOR 90 MG PO TABS
90.0000 mg | ORAL_TABLET | Freq: Two times a day (BID) | ORAL | Status: DC
  Administered 2018-03-11 – 2018-03-16 (×11): 90 mg via ORAL
  Filled 2018-03-11 (×13): qty 1

## 2018-03-11 MED ORDER — FENTANYL CITRATE (PF) 100 MCG/2ML IJ SOLN
INTRAMUSCULAR | Status: AC
  Administered 2018-03-11: 100 ug
  Filled 2018-03-11: qty 4

## 2018-03-11 MED ORDER — SODIUM CHLORIDE 0.9 % IV SOLN: Freq: Once | INTRAVENOUS | Status: DC

## 2018-03-11 MED ORDER — FENTANYL CITRATE (PF) 100 MCG/2ML IJ SOLN: 50.0000 ug | Freq: Once | INTRAMUSCULAR | Status: DC

## 2018-03-11 MED ORDER — ACETAMINOPHEN 650 MG RE SUPP
650.0000 mg | Freq: Four times a day (QID) | RECTAL | Status: DC | PRN
Start: 2018-03-11 — End: 2018-03-17

## 2018-03-11 MED ORDER — FENTANYL CITRATE (PF) 100 MCG/2ML IJ SOLN
50.0000 ug | INTRAMUSCULAR | Status: DC | PRN
  Administered 2018-03-11 – 2018-03-12 (×2): 50 ug via INTRAVENOUS
  Filled 2018-03-11: qty 2

## 2018-03-11 MED ORDER — DEXMEDETOMIDINE HCL IN NACL 200 MCG/50ML IV SOLN
0.0000 ug/kg/h | INTRAVENOUS | Status: DC
  Administered 2018-03-11 (×2): 0.6 ug/kg/h via INTRAVENOUS
  Administered 2018-03-11: 0.5 ug/kg/h via INTRAVENOUS
  Administered 2018-03-12: 0.9 ug/kg/h via INTRAVENOUS
  Administered 2018-03-12 (×2): 0.7 ug/kg/h via INTRAVENOUS
  Administered 2018-03-12: 0.4 ug/kg/h via INTRAVENOUS
  Administered 2018-03-12: 0.7 ug/kg/h via INTRAVENOUS
  Administered 2018-03-13 (×2): 0.9 ug/kg/h via INTRAVENOUS
  Filled 2018-03-11 (×12): qty 50

## 2018-03-11 NOTE — Progress Notes (Signed)
  Echocardiogram 2D Echocardiogram has been performed.  Melissa Briggs 03/11/2018, 9:42 AM

## 2018-03-11 NOTE — Progress Notes (Signed)
Bilateral lower extremity venous duplex has been completed. Negative for obvious evidence of DVT. Results were given to Dr. Denton Brick.  03/11/18 9:23 AM Carlos Levering RVT

## 2018-03-11 NOTE — Plan of Care (Signed)
[facility] Note  84 year old female past medical history of asbestosis exposure, COPD, diabetes, ILD, stroke, thyroid disease, htn who presented with anemia. Hgb prev 12 and was found to be 7.8. C/o SOB that was better with neb. [place] neg.  Dx: symptomatic anemia  Need blood transfusion and anemia labs.  Status: obs tele  Elwin Mocha, MD

## 2018-03-11 NOTE — Progress Notes (Signed)
Pt's SBP maintaining in the 60's-70's.  MD made aware.  Verbal order to start Levophed, and check CVP; if CVP less than 10, give> 1 L bolus NS, if greater than 12> go up on pressors.  CVP 9-10, 1 L bolus started.  Levophed infusion initiated.    Will continue to monitor.

## 2018-03-11 NOTE — Procedures (Signed)
Intubation Procedure Note LEILANIE RAUDA [id number] [birth year]  Procedure: Intubation Indications: Respiratory insufficiency  Procedure Details Consent: Unable to obtain consent because of altered level of consciousness. Time Out: Verified patient identification, verified procedure, site/side was marked, verified correct patient position, special equipment/implants available, medications/allergies/relevent history reviewed, required imaging and test results available.  Performed  Maximum sterile technique was used including antiseptics, gloves, hand hygiene and mask.  MAC    Evaluation Hemodynamic Status: BP stable throughout; O2 sats: stable throughout Patient's Current Condition: stable Complications: No apparent complications Patient did tolerate procedure well. Chest X-ray ordered to verify placement.  CXR: pending.   Jennet Maduro 03/11/2018

## 2018-03-11 NOTE — Procedures (Signed)
OGT Placement By MD  OGT placed under direct laryngoscopy during intubation.  Verified by auscultation.  Rush Farmer, M.D. [facility] Pulmonary/Critical Care Medicine. Pager: [phone]. After hours pager: [phone].

## 2018-03-11 NOTE — ED Notes (Signed)
Pt's husband given room assignment at [facility] long

## 2018-03-11 NOTE — Progress Notes (Signed)
Spoke with husband.  Upset that patient was intubated.  He did not wish for that but his daughter essentially talked him into it.  We spoke, now patient is LCB with no CPR/cardioversion and no trach/peg but will need further discussions.  Rush Farmer, M.D. [facility] Pulmonary/Critical Care Medicine. Pager: [phone]. After hours pager: [phone].

## 2018-03-11 NOTE — Consult Note (Signed)
PULMONARY / CRITICAL CARE MEDICINE   Name: Melissa Briggs MRN: [id number] DOB: 1928/01/01    ADMISSION DATE:  02/19/2018 CONSULTATION DATE:  03/11/2018  REFERRING MD:  Denton Brick [facility]-MD  CHIEF COMPLAINT:  CAP and respiratory failure  HISTORY OF PRESENT ILLNESS:   84 year old female with extensive PMH who presents to the hospital with SOB and AMS.  Patient was noted to have a PNA at the RUL and hypercarbic respiratory failure.  Patient is in on BiPAP, confused and incoherent so we are unable to get a more detailed history.  2D echo with RV with good function, preliminary lower ext U/S is negative for DVT making this most likely to be pneumonia.  PCCM consulted for respiratory failure.  PAST MEDICAL HISTORY :  She  has a past medical history of Asbestos exposure, Atrial premature depolarization, Cancer ([place]), COPD (chronic obstructive pulmonary disease) ([place]), Diabetes mellitus without complication ([place]), Gallstones, Hypertension, Insomnia, Interstitial lung disease ([place]), Osteopenia, RBBB, Renal disorder, Stroke ([place]), Thyroid disease, and Vitamin D deficiency.  PAST SURGICAL HISTORY: She  has a past surgical history that includes Coronary angioplasty with stent; Knee surgery; Breast surgery; and Vein Surgery.  Allergies  Allergen Reactions  . Celebrex [Celecoxib] Anaphylaxis  . Mold Extract [Trichophyton] Shortness Of Breath  . Codeine Nausea Only  . Nsaids Other (See Comments)    unknown  . Pravachol [Pravastatin Sodium] Other (See Comments)  . Simvastatin Other (See Comments)    Visual and back pain  . Sulfa Antibiotics Other (See Comments)    unknown    No current facility-administered medications on file prior to encounter.    Current Outpatient Medications on File Prior to Encounter  Medication Sig  . acetaminophen (TYLENOL) 500 MG tablet Take 500-1,000 mg by mouth every 6 (six) hours as needed for moderate pain.  Marland Kitchen albuterol (PROVENTIL HFA;VENTOLIN HFA) 108 (90 Base) MCG/ACT  inhaler Inhale into the lungs every 6 (six) hours as needed for wheezing or shortness of breath.  Marland Kitchen aspirin EC 81 MG tablet Take 81 mg by mouth daily.  Marland Kitchen atorvastatin (LIPITOR) 80 MG tablet Take 80 mg by mouth daily.  . carvedilol (COREG) 3.125 MG tablet Take 3.125 mg by mouth 2 (two) times daily with a meal.  . EPINEPHrine 0.3 mg/0.3 mL IJ SOAJ injection Inject 0.3 mg into the muscle daily as needed. Allergic reaction  . fluticasone (FLONASE) 50 MCG/ACT nasal spray Place 1 spray into both nostrils daily.  Marland Kitchen ibuprofen (ADVIL,MOTRIN) 200 MG tablet Take 200-400 mg by mouth every 6 (six) hours as needed for moderate pain.  Marland Kitchen lisinopril (PRINIVIL,ZESTRIL) 5 MG tablet Take 5 mg by mouth daily.  . nitroGLYCERIN (NITROSTAT) 0.4 MG SL tablet Place 0.4 mg under the tongue every 5 (five) minutes as needed for chest pain.  . ticagrelor (BRILINTA) 90 MG TABS tablet Take 90 mg by mouth 2 (two) times daily.   . fluticasone furoate-vilanterol (BREO ELLIPTA) 100-25 MCG/INH AEPB Inhale 1 puff into the lungs daily.    FAMILY HISTORY:  Her has no family status information on file.    SOCIAL HISTORY: She  reports that she has never smoked. She has never used smokeless tobacco. She reports that she drank alcohol. She reports that she has current or past drug history.  REVIEW OF SYSTEMS:   Unable to obtain  SUBJECTIVE:  Very upset by mask and asking to be untied  VITAL SIGNS: BP (!) 144/71   Pulse (!) 102   Temp 97.6 F (36.4 C) (  Axillary)   Resp 20   Ht 5\' 3"  (1.6 m)   Wt 195 lb (88.5 kg)   SpO2 97%   BMI 34.54 kg/m   HEMODYNAMICS:    VENTILATOR SETTINGS: FiO2 (%):  [50 %-75 %] 50 %  INTAKE / OUTPUT: I/O last 3 completed shifts: In: 240 [P.O.:240] Out: 100 [Urine:100]  PHYSICAL EXAMINATION: General:  Chronically ill appearing female, very distraught by BiPAP and continues to pull it off when unrestrained Neuro:  Confused but interactive, moving all ext to command HEENT:  [place]/AT, PERRL,  EOM-I and MMM Cardiovascular:  RRR, Nl S1/S2 and -M/R/G. Lungs:  Coarse BS diffusely Abdomen:  Soft, NT, ND and +BS Musculoskeletal:  -edema and -tenderness Skin:  Intact  LABS:  BMET Recent Labs  Lab 03/14/2018 2241 03/11/18 0837  NA 139 138  K 4.1 4.3  CL 102 102  CO2 27 26  BUN 32* 27*  CREATININE 1.09* 0.80  GLUCOSE 113* 142*    Electrolytes Recent Labs  Lab 03/06/2018 2241 03/11/18 0837  CALCIUM 8.3* 8.0*    CBC Recent Labs  Lab 02/22/2018 2241 03/11/18 0446  WBC 8.6  --   HGB 7.8* 7.6*  HCT 26.8* 25.6*  PLT 287  --     Coag's No results for input(s): APTT, INR in the last 168 hours.  Sepsis Markers Recent Labs  Lab 03/11/18 0837  PROCALCITON 0.10    ABG Recent Labs  Lab 03/11/18 0651 03/11/18 0927  PHART 7.172* 7.289*  PCO2ART 81.6* 62.8*  PO2ART 110* 101    Liver Enzymes Recent Labs  Lab 03/06/2018 2241  AST 30  ALT 17  ALKPHOS 56  BILITOT 0.4  ALBUMIN 3.4*    Cardiac Enzymes Recent Labs  Lab 02/25/2018 2241 03/11/18 0708  TROPONINI <0.03 0.41*    Glucose Recent Labs  Lab 03/11/18 0147  GLUCAP 112*    Imaging Dg Chest 2 View  Result Date: 03/11/2018 CLINICAL DATA:  84 year old female with shortness of breath for 2 days. Lower extremity swelling. EXAM: CHEST - 2 VIEW COMPARISON:  01/04/2018 and earlier. FINDINGS: Chronic exaggerated thoracic kyphosis and increased AP dimension to the chest. Stable cardiomegaly and mediastinal contours. Calcified aortic atherosclerosis. No pneumothorax, pulmonary edema, pleural effusion or acute pulmonary opacity identified. Osteopenia. No acute osseous abnormality identified. Negative visible bowel gas pattern. Chronic postoperative changes to the right chest wall/axilla. IMPRESSION: 1.  No acute cardiopulmonary abnormality. 2. Chronic cardiomegaly, Calcified aortic atherosclerosis. Electronically Signed   By: Genevie Ann M.D.   On: 03/11/2018 00:04   Dg Chest Port 1v Same Day  Result Date:  03/11/2018 CLINICAL DATA:  Respiratory distress. EXAM: PORTABLE CHEST 1 VIEW COMPARISON:  02/23/2018 FINDINGS: The cardiomediastinal silhouette is unchanged. Aortic atherosclerosis is noted. There is new airspace consolidation in the right upper lobe. Milder left upper lobe airspace opacity is also now present. No sizable pleural effusion or pneumothorax is identified. Surgical clips are present in the right axilla/chest wall. IMPRESSION: New right greater than left upper lobe airspace opacities concerning for pneumonia. Electronically Signed   By: Logan Bores M.D.   On: 03/11/2018 07:23   Dg Hip Unilat W Or Wo Pelvis 2-3 Views Left  Result Date: 03/11/2018 CLINICAL DATA:  Bilateral lower extremity swelling and hip pain EXAM: DG HIP (WITH OR WITHOUT PELVIS) 2-3V LEFT COMPARISON:  None. FINDINGS: There is no evidence of hip fracture or dislocation. There is no evidence of arthropathy or other focal bone abnormality. IMPRESSION: Negative. Electronically Signed   By:  Ulyses Jarred M.D.   On: 03/11/2018 00:08     STUDIES:  CXR 3/25>>>Pneumonia at RUL  CULTURES: Blood 3/25>>> Urine 3/25>>> Sputum 3/25>>>  ANTIBIOTICS: Rocephine 3/25>>> Zithromax 3/25>>>  SIGNIFICANT EVENTS:  3/25>>>VDRF on BiPAP  LINES/TUBES: PIV  DISCUSSION: 84 year old female with RUL pneumonia that have had a very poor quality of life for the past 9 months since her MI.  Patient is clearly in respiratory failure and not following commands, discussed with PCCM-NP.  ASSESSMENT / PLAN:  PULMONARY A: VDRF due to PNA.  Essentially ruled out PE at this point.  Had extensive discussion with husband here and daughter over the phone.  Elected to proceed with full code status. P:   - Intubate - Full vent support - Adjust vent for ABG - CXR and ABG post intubation  CARDIOVASCULAR A:  Borderline BP P:  - IVF resuscitation - Tele monitoring - Hold home anti-HTN  RENAL A:   No active issues P:   - BMET in AM -  IVF - Replace electrolytes as indicated  GASTROINTESTINAL A:   No active issues P:   - Protonix - TF per nutrition post intubation  HEMATOLOGIC A:   Anemia P:  - Transfuse only if <7 - CBC in AM  INFECTIOUS A:   RUL PNA P:   - Rocephin - Zithromax - F/U on cultures  ENDOCRINE A:   DM   P:   - ISS - CBGs  NEUROLOGIC A:   AMS due to sepsis P:   RASS goal: 0 - Fentanyl drip - Precedex drip  FAMILY  - Updates: Husband and daughter updated at length bedside, full code status  - Inter-disciplinary family meet or Palliative Care meeting due by:  day 7  The patient is critically ill with multiple organ systems failure and requires high complexity decision making for assessment and support, frequent evaluation and titration of therapies, application of advanced monitoring technologies and extensive interpretation of multiple databases.   Critical Care Time devoted to patient care services described in this note is  105  Minutes. This time reflects time of care of this signee Dr Jennet Maduro. This critical care time does not reflect procedure time, or teaching time or supervisory time of PA/NP/Med student/Med Resident etc but could involve care discussion time.  Rush Farmer, M.D. [facility] Pulmonary/Critical Care Medicine. Pager: [phone]. After hours pager: [phone].  03/11/2018, 10:52 AM

## 2018-03-11 NOTE — Progress Notes (Signed)
Patient is now intubated, as of 03/11/18 hospitalist service will sign off, PCCM service will let us know when patient is extubated and ready for transfer back to [facility] hospitalist service  Thank you  Roxan Hockey, MD

## 2018-03-11 NOTE — Progress Notes (Signed)
Patient Demographics:    Melissa Briggs, is a 84 y.o. female, DOB - [birth year], BSW:[id number]  Admit date - 02/15/2018   Admitting Physician Elwin Mocha, MD  Outpatient Primary MD for the patient is No primary care provider on file.  LOS - 0   Chief Complaint  Patient presents with  . Shortness of Breath        Subjective:    Melissa Briggs today has no fevers, no emesis,  No chest pain,  Patient seen and evaluated, chart reviewed, please see EMR for updated orders. Please see full H&P dictated by admitting physician Dr Alda Lea for same date of service.   I evaluated patient in ICU , Dr. Alcario Drought at bedside initially, later on Dr. Nelda Marseille also present in the ICU..... Patient developed acute respiratory failure with hypoxia and hypercapnia and respiratory acidosis, initially did okay with BiPAP however patient was noncompliant with BiPAP she continued to remove BiPAP despite persuasion, she became confused and coherent and would not keep the BiPAP on so Dr. Nelda Marseille after talking to patient's husband and family elected to intubate patient.  Please see documentation from PCCM attending Dr. Nelda Marseille   Assessment  & Plan :    Principal Problem:   Symptomatic anemia Active Problems:   CAD S/P percutaneous coronary angioplasty   Hypoxia   Acute respiratory failure with hypoxemia ([place])   Community acquired pneumonia of right upper lobe of lung ([place])   Brief summary:- Patient seen and evaluated, chart reviewed, please see EMR for updated orders. Please see full H&P dictated by admitting physician Dr Alcario Drought for same date of service.    I evaluated patient in ICU , Dr. Alcario Drought at bedside initially, later on Dr. Nelda Marseille also present in the ICU..... Patient developed acute respiratory failure with hypoxia and hypercapnia and respiratory acidosis, initially did okay with BiPAP however patient was noncompliant  with BiPAP she continued to remove BiPAP despite persuasion, she became confused and coherent and would not keep the BiPAP on so Dr. Nelda Marseille after talking to patient's husband and family elected to intubate patient.  Please see documentation from PCCM attending Dr. Nelda Marseille   Plan:-  1)Acute Hypoxic and Hypercapnic Respiratory Distress----now intubated please see discussion above, azithromycin/Rocephin for possible pneumonia please see chest x-ray suggestive of bilateral upper lobe pneumonia right more than left, however pro-calcitonin is not elevated, patient has no fevers no leukocytosis no productive cough.  Venous Dopplers of the lower extremity without definite DVT, echocardiogram with preserved EF, no right heart strain to suggest PE.  BNP elevated to 294 from 195 the previously.  Follow-up management of her respiratory failure Dr. Nelda Marseille in the PCCM team patient is now intubated  2)Symptomatic Anemia-  Given History of CAD with STEMI and angioplasty with stent placement in July 2018 consider keeping hemoglobin close to 9, we defer to Dr. Nelda Marseille.  Stool Hemoccult is negative, anemia workup suggest iron deficiency.   3)Social/EThics-please see separate documentation from Dr. Nelda Marseille, patient is now LCB with no CPR/cardioversion and no trach PEG.  Patient remains intubated  4)H/o CAD-status post angioplasty and stent placement after NSTEMI in July 2018 at [facility], echo with preserved EF, currently on Brilinta and aspirin as well as Coreg and Lipitor, further management  per  PCCM team  5) bilateral lower extremity cellulitis-patient is already on Rocephin for pneumonia, monitor closely  CRITICAL CARE Performed by: Roxan Hockey   Total critical care time: 43 minutes  Critical care time was exclusive of separately billable procedures and treating other patients.  Critical care was necessary to treat or prevent imminent or life-threatening deterioration.  Critical care was time  spent personally by me on the following activities: development of treatment plan with patient and/or surrogate as well as nursing, discussions with consultants, evaluation of patient's response to treatment, examination of patient, obtaining history from patient or surrogate, ordering and performing treatments and interventions, ordering and review of laboratory studies, ordering and review of radiographic studies, pulse oximetry and re-evaluation of patient's condition.   Consults  :  PCCM  Prophylaxis - SCD  Lab Results  Component Value Date   PLT 287 03/03/2018    Inpatient Medications  Scheduled Meds: . acetaminophen  650 mg Rectal Once  . aspirin EC  81 mg Oral Daily  . atorvastatin  80 mg Oral q1800  . carvedilol  3.125 mg Oral BID WC  . chlorhexidine gluconate (MEDLINE KIT)  15 mL Mouth Rinse BID  . fentaNYL (SUBLIMAZE) injection  50 mcg Intravenous Once  . fluticasone furoate-vilanterol  1 puff Inhalation Daily  . ipratropium-albuterol  3 mL Nebulization Q6H  . lisinopril  5 mg Oral Daily  . mouth rinse  15 mL Mouth Rinse QID  . pantoprazole sodium  40 mg Per Tube Q1200  . ticagrelor  90 mg Oral BID   Continuous Infusions: . sodium chloride    . azithromycin Stopped (03/11/18 1310)  . cefTRIAXone (ROCEPHIN)  IV Stopped (03/11/18 1130)  . dexmedetomidine (PRECEDEX) IV infusion 0.6 mcg/kg/hr (03/11/18 1239)  . fentaNYL infusion INTRAVENOUS 300 mcg/hr (03/11/18 1240)   PRN Meds:.acetaminophen **OR** acetaminophen, albuterol, fentaNYL, fentaNYL (SUBLIMAZE) injection, ondansetron **OR** ondansetron (ZOFRAN) IV    Anti-infectives (From admission, onward)   Start     Dose/Rate Route Frequency Ordered Stop   03/11/18 1000  azithromycin (ZITHROMAX) 500 mg in sodium chloride 0.9 % 250 mL IVPB     500 mg 250 mL/hr over 60 Minutes Intravenous Every 24 hours 03/11/18 0728 03/18/18 0959   03/11/18 0900  cefTRIAXone (ROCEPHIN) 1 g in sodium chloride 0.9 % 100 mL IVPB     1  g 200 mL/hr over 30 Minutes Intravenous Every 24 hours 03/11/18 0728 03/18/18 0859        Objective:   Vitals:   03/11/18 1121 03/11/18 1137 03/11/18 1155 03/11/18 1200  BP:    (!) 144/65  Pulse:  (!) 101  99  Resp:  18  18  Temp:   98.2 F (36.8 C)   TempSrc:   Oral   SpO2:  100%  100%  Weight:      Height: 5' 3" (1.6 m)       Wt Readings from Last 3 Encounters:  02/23/2018 88.5 kg (195 lb)     Intake/Output Summary (Last 24 hours) at 03/11/2018 1326 Last data filed at 03/11/2018 0400 Gross per 24 hour  Intake 240 ml  Output 100 ml  Net 140 ml   Physical Exam  Gen:-Initially patient was awake and alert, patient became more confused and incoherent later, now intubated HEENT:- BIPAP mask earlier, now intubated Lungs-diminished breath sounds with scattered wheezes and rhonchi  CV- S1, S2 normal Abd-  +ve B.Sounds, Abd Soft, No tenderness,    Extremity/Skin:-2+ pitting edema, bilateral erythema  warmth tenderness and swelling right more than left suspected cellulitis  psych-no cooperative incoherent now intubated  neuro she did complain of left hip painprior to intubation patient was moving all extremities well -without any obvious new focal deficits   Data Review:   Micro Results Recent Results (from the past 240 hour(s))  MRSA PCR Screening     Status: None   Collection Time: 03/11/18  8:58 AM  Result Value Ref Range Status   MRSA by PCR NEGATIVE NEGATIVE Final    Comment:        The GeneXpert MRSA Assay (FDA approved for NASAL specimens only), is one component of a comprehensive MRSA colonization surveillance program. It is not intended to diagnose MRSA infection nor to guide or monitor treatment for MRSA infections. Performed at [facility], [place] [address], [place], [place] [address]     Radiology Reports Dg Chest 2 View  Result Date: 03/11/2018 CLINICAL DATA:  84 year old female with shortness of breath for 2 days. Lower extremity  swelling. EXAM: CHEST - 2 VIEW COMPARISON:  01/04/2018 and earlier. FINDINGS: Chronic exaggerated thoracic kyphosis and increased AP dimension to the chest. Stable cardiomegaly and mediastinal contours. Calcified aortic atherosclerosis. No pneumothorax, pulmonary edema, pleural effusion or acute pulmonary opacity identified. Osteopenia. No acute osseous abnormality identified. Negative visible bowel gas pattern. Chronic postoperative changes to the right chest wall/axilla. IMPRESSION: 1.  No acute cardiopulmonary abnormality. 2. Chronic cardiomegaly, Calcified aortic atherosclerosis. Electronically Signed   By: Genevie Ann M.D.   On: 03/11/2018 00:04   Dg Abd 1 View  Result Date: 03/11/2018 CLINICAL DATA:  Enteric tube placement EXAM: ABDOMEN - 1 VIEW COMPARISON:  None. FINDINGS: Enteric tube terminates in the gastric fundus. No dilated small bowel loops. Moderate to large stool in the large bowel. No evidence of pneumatosis or pneumoperitoneum. Levocurvature of the lumbar spine with marked lumbar spondylosis. No radiopaque nephrolithiasis. Superior approach central venous catheter terminates at the cavoatrial junction. IMPRESSION: Enteric tube terminates in the gastric fundus. Nonobstructive bowel gas pattern. Moderate to large colonic stool volume suggests constipation. Electronically Signed   By: Ilona Sorrel M.D.   On: 03/11/2018 12:11   Dg Chest Port 1 View  Result Date: 03/11/2018 CLINICAL DATA:  Intubated EXAM: PORTABLE CHEST 1 VIEW COMPARISON:  Chest radiograph from earlier today. FINDINGS: Endotracheal tube tip is 2.8 cm above the carina. Enteric tube enters stomach with the tip not seen on this image. Left subclavian central venous catheter terminates at the cavoatrial junction. Surgical clips overlie the right axilla. Stable cardiomediastinal silhouette with mild cardiomegaly. No pneumothorax. Possible small right pleural effusion, stable. No left pleural effusion. Patchy upper parahilar lung  opacities bilaterally, unchanged. IMPRESSION: 1. Well-positioned support structures.  No pneumothorax. 2. Stable mild cardiomegaly with patchy upper parahilar lung opacities, favor pulmonary edema, although the differential includes multilobar pneumonia. 3. Possible stable small right pleural effusion. Electronically Signed   By: Ilona Sorrel M.D.   On: 03/11/2018 12:09   Dg Chest Port 1v Same Day  Result Date: 03/11/2018 CLINICAL DATA:  Respiratory distress. EXAM: PORTABLE CHEST 1 VIEW COMPARISON:  02/18/2018 FINDINGS: The cardiomediastinal silhouette is unchanged. Aortic atherosclerosis is noted. There is new airspace consolidation in the right upper lobe. Milder left upper lobe airspace opacity is also now present. No sizable pleural effusion or pneumothorax is identified. Surgical clips are present in the right axilla/chest wall. IMPRESSION: New right greater than left upper lobe airspace opacities concerning for pneumonia. Electronically Signed   By:  Logan Bores M.D.   On: 03/11/2018 07:23   Dg Hip Unilat W Or Wo Pelvis 2-3 Views Left  Result Date: 03/11/2018 CLINICAL DATA:  Bilateral lower extremity swelling and hip pain EXAM: DG HIP (WITH OR WITHOUT PELVIS) 2-3V LEFT COMPARISON:  None. FINDINGS: There is no evidence of hip fracture or dislocation. There is no evidence of arthropathy or other focal bone abnormality. IMPRESSION: Negative. Electronically Signed   By: Ulyses Jarred M.D.   On: 03/11/2018 00:08     CBC Recent Labs  Lab 03/05/2018 2241 03/11/18 0446  WBC 8.6  --   HGB 7.8* 7.6*  HCT 26.8* 25.6*  PLT 287  --   MCV 91.8  --   MCH 26.7  --   MCHC 29.1*  --   RDW 17.3*  --   LYMPHSABS 1.5  --   MONOABS 1.1*  --   EOSABS 0.4  --   BASOSABS 0.0  --     Chemistries  Recent Labs  Lab 02/23/2018 2241 03/11/18 0837  NA 139 138  K 4.1 4.3  CL 102 102  CO2 27 26  GLUCOSE 113* 142*  BUN 32* 27*  CREATININE 1.09* 0.80  CALCIUM 8.3* 8.0*  AST 30  --   ALT 17  --   ALKPHOS  56  --   BILITOT 0.4  --    ------------------------------------------------------------------------------------------------------------------ No results for input(s): CHOL, HDL, LDLCALC, TRIG, CHOLHDL, LDLDIRECT in the last 72 hours.  No results found for: HGBA1C ------------------------------------------------------------------------------------------------------------------ No results for input(s): TSH, T4TOTAL, T3FREE, THYROIDAB in the last 72 hours.  Invalid input(s): FREET3 ------------------------------------------------------------------------------------------------------------------ Recent Labs    03/11/18 0446  VITAMINB12 260  FOLATE 9.4  FERRITIN 24  TIBC 336  IRON 25*  RETICCTPCT 2.2    Coagulation profile No results for input(s): INR, PROTIME in the last 168 hours.  No results for input(s): DDIMER in the last 72 hours.  Cardiac Enzymes Recent Labs  Lab 02/19/2018 2241 03/11/18 0708  TROPONINI <0.03 0.41*   ------------------------------------------------------------------------------------------------------------------    Component Value Date/Time   BNP 294.3 (H) 03/11/2018 1610     Roxan Hockey M.D on 03/11/2018 at 1:26 PM  Between 7am to 7pm - Pager - [phone]  After 7pm go to [email or web address] - password [email or web address]  [facility] -  Office  [phone]   Voice Recognition Viviann Spare dictation system was used to create this note, attempts have been made to correct errors. Please contact the author with questions and/or clarifications.

## 2018-03-11 NOTE — Progress Notes (Signed)
Pt transported to CT while on vent.  Pt remained stable and comfortable throughout the trip.  Pt then transported back to room 1228.  Trip occurred without incident.  Pt suctioned orally and endotracheally and oral care performed prior to transport.

## 2018-03-11 NOTE — Significant Event (Signed)
Patient developed respiratory distress on floor.  Not better after breathing treatment.  Getting transferred to SDU for rescue bipap.  Went from 2L [place] to 15 [place] now satting 90%.  Had NOT yet begun PRBC transfusion. (not a transfusion reaction).  Few crackles.  Definite prolonged expiratory phase.  1) repeat CXR 2) rescue BIPAP 3) adult wheeze protocol 4) lasix if CHF seen on CXR, but not 100% sure this is CHF 5) ABG stat 6) if CXR neg, consider COPD exacerbation vs PE, though not very tachycardic for PE. 7) getting repeat EKG now 8) adding trop

## 2018-03-11 NOTE — H&P (Signed)
History and Physical    Melissa Briggs WUJ:[id number] DOB: [birth year] DOA: 03/13/2018  PCP: No primary care provider on file.  Patient coming from: Home  I have personally briefly reviewed patient's old medical records in [place]  Chief Complaint: Leg swelling, L hip pain  HPI: Melissa Briggs is a 84 y.o. female with medical history significant of COPD, HTN, prior CVA, CAD with STEMI in June 2018 s/p PCI with DES placement at [facility].  Patient is on ASA and brilinta.  Patient presents to the ED for evaluation of 1 week history of onset and worsening of BLE edema.  She has difficulty breathing that is essentially at baseline, no worse today than any other day since they put her on O2 after last summer.  She also has chronic L hip pain, located in groin, intermittent, severe.   ED Course: CXR neg, X ray L hip neg, hip pain controlled with fentanyl.  HGB 7.8 down from 12.x in Jan this year (care everywhere).  Hemoccult is neg.  Patient denies black tarry stools, blood in stools, or even loose stools.  Patient sent in for obs for transfusion for symptomatic anemia.   Review of Systems: As per HPI otherwise 10 point review of systems negative.   Past Medical History:  Diagnosis Date  . Asbestos exposure   . Atrial premature depolarization   . Cancer ([place])   . COPD (chronic obstructive pulmonary disease) ([place])   . Diabetes mellitus without complication ([place])   . Gallstones   . Hypertension    essential  . Insomnia   . Interstitial lung disease ([place])   . Osteopenia   . RBBB   . Renal disorder    stage 3 kidney disease  . Stroke ([place])    tia  . Thyroid disease    acquired hypothyroidism  . Vitamin D deficiency     Past Surgical History:  Procedure Laterality Date  . BREAST SURGERY    . CORONARY ANGIOPLASTY WITH STENT PLACEMENT    . KNEE SURGERY    . VEIN SURGERY       reports that she has never smoked. She has never used smokeless tobacco. She reports that  she drank alcohol. She reports that she has current or past drug history.  Allergies  Allergen Reactions  . Celebrex [Celecoxib] Anaphylaxis  . Codeine Nausea Only  . Pravachol [Pravastatin Sodium] Other (See Comments)    History reviewed. No pertinent family history.   Prior to Admission medications   Medication Sig Start Date End Date Taking? Authorizing Provider  albuterol (PROVENTIL HFA;VENTOLIN HFA) 108 (90 Base) MCG/ACT inhaler Inhale into the lungs every 6 (six) hours as needed for wheezing or shortness of breath.   Yes [provider]  aspirin EC 81 MG tablet Take 81 mg by mouth daily.   Yes [provider]  atorvastatin (LIPITOR) 80 MG tablet Take 80 mg by mouth daily.   Yes [provider]  carvedilol (COREG) 3.125 MG tablet Take 3.125 mg by mouth 2 (two) times daily with a meal.   Yes [provider]  fluticasone furoate-vilanterol (BREO ELLIPTA) 100-25 MCG/INH AEPB Inhale 1 puff into the lungs daily.   Yes [provider]  lisinopril (PRINIVIL,ZESTRIL) 5 MG tablet Take 5 mg by mouth daily.   Yes [provider]  ticagrelor (BRILINTA) 90 MG TABS tablet Take by mouth 2 (two) times daily.   Yes [provider]    Physical Exam: Vitals:  03/11/18 0031 03/11/18 0135 03/11/18 0228 03/11/18 0231  BP: 137/63 (!) 150/70 (!) 85/61 132/62  Pulse: (!) 103 100 (!) 101   Resp: (!) 27 (!) 24    Temp:  98.1 F (36.7 C) 97.8 F (36.6 C)   TempSrc:  Oral Oral   SpO2: 94% 96% 98%   Weight:      Height:        Constitutional: NAD, calm, comfortable, Pale appearing Eyes: PERRL, lids and conjunctivae normal ENMT: Mucous membranes are moist. Posterior pharynx clear of any exudate or lesions.Normal dentition.  Neck: normal, supple, no masses, no thyromegaly Respiratory: clear to auscultation bilaterally, no wheezing, no crackles. Normal respiratory effort. No accessory muscle use.  Cardiovascular: Regular rate and rhythm,  no murmurs / rubs / gallops. 2-3+ BLE edema. 2+ pedal pulses. No carotid bruits.  Abdomen: no tenderness, no masses palpated. No hepatosplenomegaly. Bowel sounds positive.  Musculoskeletal: no clubbing / cyanosis. No joint deformity upper and lower extremities. Good ROM, no contractures. Normal muscle tone.  Skin: no rashes, lesions, ulcers. No induration Neurologic: CN 2-12 grossly intact. Sensation intact, DTR normal. Strength 5/5 in all 4.  Psychiatric: Normal judgment and insight. Alert and oriented x 3. Normal mood.    Labs on Admission: I have personally reviewed following labs and imaging studies  CBC: Recent Labs  Lab 02/28/2018 2241  WBC 8.6  NEUTROABS 5.6  HGB 7.8*  HCT 26.8*  MCV 91.8  PLT 387   Basic Metabolic Panel: Recent Labs  Lab 02/23/2018 2241  NA 139  K 4.1  CL 102  CO2 27  GLUCOSE 113*  BUN 32*  CREATININE 1.09*  CALCIUM 8.3*   GFR: Estimated Creatinine Clearance: 38.3 mL/min (A) (by C-G formula based on SCr of 1.09 mg/dL (H)). Liver Function Tests: Recent Labs  Lab 03/15/2018 2241  AST 30  ALT 17  ALKPHOS 56  BILITOT 0.4  PROT 7.5  ALBUMIN 3.4*   No results for input(s): LIPASE, AMYLASE in the last 168 hours. No results for input(s): AMMONIA in the last 168 hours. Coagulation Profile: No results for input(s): INR, PROTIME in the last 168 hours. Cardiac Enzymes: Recent Labs  Lab 03/16/2018 2241  TROPONINI <0.03   BNP (last 3 results) No results for input(s): PROBNP in the last 8760 hours. HbA1C: No results for input(s): HGBA1C in the last 72 hours. CBG: Recent Labs  Lab 03/11/18 0147  GLUCAP 112*   Lipid Profile: No results for input(s): CHOL, HDL, LDLCALC, TRIG, CHOLHDL, LDLDIRECT in the last 72 hours. Thyroid Function Tests: No results for input(s): TSH, T4TOTAL, FREET4, T3FREE, THYROIDAB in the last 72 hours. Anemia Panel: No results for input(s): VITAMINB12, FOLATE, FERRITIN, TIBC, IRON, RETICCTPCT in the last 72 hours. Urine  analysis: No results found for: COLORURINE, APPEARANCEUR, LABSPEC, PHURINE, GLUCOSEU, HGBUR, BILIRUBINUR, KETONESUR, PROTEINUR, UROBILINOGEN, NITRITE, LEUKOCYTESUR  Radiological Exams on Admission: Dg Chest 2 View  Result Date: 03/11/2018 CLINICAL DATA:  84 year old female with shortness of breath for 2 days. Lower extremity swelling. EXAM: CHEST - 2 VIEW COMPARISON:  01/04/2018 and earlier. FINDINGS: Chronic exaggerated thoracic kyphosis and increased AP dimension to the chest. Stable cardiomegaly and mediastinal contours. Calcified aortic atherosclerosis. No pneumothorax, pulmonary edema, pleural effusion or acute pulmonary opacity identified. Osteopenia. No acute osseous abnormality identified. Negative visible bowel gas pattern. Chronic postoperative changes to the right chest wall/axilla. IMPRESSION: 1.  No acute cardiopulmonary abnormality. 2. Chronic cardiomegaly, Calcified aortic atherosclerosis. Electronically Signed   By: Herminio Heads.D.  On: 03/11/2018 00:04   Dg Hip Unilat W Or Wo Pelvis 2-3 Views Left  Result Date: 03/11/2018 CLINICAL DATA:  Bilateral lower extremity swelling and hip pain EXAM: DG HIP (WITH OR WITHOUT PELVIS) 2-3V LEFT COMPARISON:  None. FINDINGS: There is no evidence of hip fracture or dislocation. There is no evidence of arthropathy or other focal bone abnormality. IMPRESSION: Negative. Electronically Signed   By: Ulyses Jarred M.D.   On: 03/11/2018 00:08    EKG: Independently reviewed.  Assessment/Plan Principal Problem:   Symptomatic anemia Active Problems:   CAD S/P percutaneous coronary angioplasty    1. Symptomatic anemia - 1. Getting anemia panel 2. Transfuse 1u PRBC 3. H/H post transfusion 4. Hemoccult neg and no stigmata of GIB, but dont know where else she could be loosing blood too 1. Call GI in AM 2. But right now I dont have a reason to keep her NPO nor hold her anti-platelet agents she takes for stents 2. CAD -  1. Continue home meds as above  including anti-platelet agents for DES placed 9 months ago. 2. Tele monitor 3. Consider echo if edema persists despite correction of HGB.  DVT prophylaxis: SCDs Code Status: Full Family Communication: No family in room Disposition Plan: Home after admit Consults called: None Admission status: Place in obs   [place], [place] [facility] Pager [phone]  If 7AM-7PM, please contact day team taking care of patient [email or web address] Password [facility]  03/11/2018, 4:18 AM

## 2018-03-11 NOTE — Procedures (Signed)
Central Venous Catheter Insertion Procedure Note NATALEIGH GRIFFIN [id number] [birth year]  Procedure: Insertion of Central Venous Catheter Indications: Assessment of intravascular volume and Drug and/or fluid administration  Procedure Details Consent: Unable to obtain consent because of emergent medical necessity. Time Out: Verified patient identification, verified procedure, site/side was marked, verified correct patient position, special equipment/implants available, medications/allergies/relevent history reviewed, required imaging and test results available.  Performed  Maximum sterile technique was used including antiseptics, cap, gloves, gown, hand hygiene, mask and sheet. Skin prep: Chlorhexidine; local anesthetic administered A antimicrobial bonded/coated triple lumen catheter was placed in the left subclavian vein using the Seldinger technique.  Evaluation Blood flow good Complications: No apparent complications Patient did tolerate procedure well. Chest X-ray ordered to verify placement.  CXR: pending.  Done emergently during intubation after patient lost IV access and bedside RN was unable to obtain peripheral access but patient was partially sedated.   YACOUB,WESAM 03/11/2018, 11:44 AM

## 2018-03-11 NOTE — Progress Notes (Signed)
[facility] Progress Note Patient Name: Melissa Briggs DOB: 1928-04-27 MRN: [id number]   Date of Service  03/11/2018  HPI/Events of Note  Patient is intubated and ventilated and is admitted to a stepdown bed.   eICU Interventions  Will transfer patient to ICU status.      Intervention Category Major Interventions: Other:  Lysle Dingwall 03/11/2018, 9:10 PM

## 2018-03-11 NOTE — Significant Event (Addendum)
Despite expecting to see CHF on CXR.  It actually looks more like PNA according to radiology, consolidation in RUL possibly with partial collapse.  1) BNP ordered and pending, pro calcitonin ordered and pending 2) Spoke with PCCM:  They will see patient in near future. 3) seems stabilized on BIPAP for the moment. 4) will order rocephin / azithromycin for presumed CAP 5) will order BLE venous duplex to r/o DVT 6) will order CTA PE to r/o PE with RUL pulm infarct

## 2018-03-11 NOTE — Progress Notes (Signed)
Patient c/o SOB and restlessness. Tried to reposition patient in the bed with no relief. Respiratory called for PRN breathing treatment. Patient O2 sats low after breathing treatment and increase of oxygen. Rapid response called and Dr. Alcario Drought notified of patient condition change. Patient transferred to stepdown ICU, room 1228, for further management. Patient's husband called and updated over the phone. Bedside shift report given.

## 2018-03-11 NOTE — Progress Notes (Signed)
Rapid Response Event Note  Overview: Patient developed respiratory distress and I was called by the RN.  The patient had prolonged expiratory phase ; few crackles noted; SOB; Bil pedal edema; decreased LOC.  Started RRT call at [phone]      Initial Focused Assessment: Patient able to answer questions; verbalized SOB and having a "hard time to breath."  Bil pedal edema; respiratory distress noted with prolonged expiratory phase; few crackles; UOP WNL; patient was on 2L [place] during the night and respiratoy changed over to high flow 15% with sats low 90's  Interventions: Respiratory treatment given; O2 high flow at 15; Chest x-ray; order for Bipap and transfer to ICU; ABG ordered; VS BP 137/76; P 107; 95% sat on 15 high flow O2; R 34; temp 98.5  Plan of Care (if not transferred): Patient transferred to SDU; chest x-ray obtained; Dr. Alcario Drought at bedside and awaiting radiology's read of x-ray; BiPaP started; ABG obtained; per Dr, Alcario Drought he will hold off on lasix for now until chest xray read.  Reported off to [facility] ICU CN.  Floor RN notified patients husband of transfer and above situation.    Event Summary:   at      at          [facility], Leonor Liv

## 2018-03-12 ENCOUNTER — Inpatient Hospital Stay (HOSPITAL_COMMUNITY): Payer: Medicare HMO

## 2018-03-12 LAB — BLOOD GAS, ARTERIAL
ACID-BASE EXCESS: 1.5 mmol/L (ref 0.0–2.0)
Bicarbonate: 28 mmol/L (ref 20.0–28.0)
DRAWN BY: 422461
FIO2: 40
MECHVT: 420 mL
O2 SAT: 92.3 %
PATIENT TEMPERATURE: 97.6
PCO2 ART: 57.5 mmHg — AB (ref 32.0–48.0)
PEEP/CPAP: 5 cmH2O
PH ART: 7.305 — AB (ref 7.350–7.450)
RATE: 18 resp/min
pO2, Arterial: 69.7 mmHg — ABNORMAL LOW (ref 83.0–108.0)

## 2018-03-12 LAB — TYPE AND SCREEN
ABO/RH(D): A POS
Antibody Screen: NEGATIVE
Unit division: 0

## 2018-03-12 LAB — HEMOGLOBIN AND HEMATOCRIT, BLOOD
HCT: 28.9 % — ABNORMAL LOW (ref 36.0–46.0)
HEMOGLOBIN: 8.8 g/dL — AB (ref 12.0–15.0)

## 2018-03-12 LAB — BASIC METABOLIC PANEL
Anion gap: 9 (ref 5–15)
BUN: 24 mg/dL — AB (ref 6–20)
CHLORIDE: 106 mmol/L (ref 101–111)
CO2: 25 mmol/L (ref 22–32)
CREATININE: 0.69 mg/dL (ref 0.44–1.00)
Calcium: 7.7 mg/dL — ABNORMAL LOW (ref 8.9–10.3)
GFR calc Af Amer: >60 mL/min (ref >60)
GFR calc non Af Amer: >60 mL/min (ref >60)
GLUCOSE: 109 mg/dL — AB (ref 65–99)
Potassium: 4.3 mmol/L (ref 3.5–5.1)
Sodium: 140 mmol/L (ref 135–145)

## 2018-03-12 LAB — BPAM RBC
BLOOD PRODUCT EXPIRATION DATE: 201904052359
ISSUE DATE / TIME: 201903251720
UNIT TYPE AND RH: 6200

## 2018-03-12 LAB — GLUCOSE, CAPILLARY
GLUCOSE-CAPILLARY: 102 mg/dL — AB (ref 65–99)
GLUCOSE-CAPILLARY: 123 mg/dL — AB (ref 65–99)
Glucose-Capillary: 123 mg/dL — ABNORMAL HIGH (ref 65–99)

## 2018-03-12 LAB — CBC
HEMATOCRIT: 28.5 % — AB (ref 36.0–46.0)
Hemoglobin: 8.6 g/dL — ABNORMAL LOW (ref 12.0–15.0)
MCH: 27.7 pg (ref 26.0–34.0)
MCHC: 30.2 g/dL (ref 30.0–36.0)
MCV: 91.9 fL (ref 78.0–100.0)
Platelets: 254 10*3/uL (ref 150–400)
RBC: 3.1 MIL/uL — ABNORMAL LOW (ref 3.87–5.11)
RDW: 17.3 % — ABNORMAL HIGH (ref 11.5–15.5)
WBC: 12.2 10*3/uL — ABNORMAL HIGH (ref 4.0–10.5)

## 2018-03-12 LAB — URINE CULTURE

## 2018-03-12 LAB — PHOSPHORUS: Phosphorus: 3 mg/dL (ref 2.5–4.6)

## 2018-03-12 LAB — TROPONIN I: Troponin I: 0.94 ng/mL (ref <0.03)

## 2018-03-12 LAB — MAGNESIUM: Magnesium: 2.1 mg/dL (ref 1.7–2.4)

## 2018-03-12 MED ORDER — ASPIRIN 81 MG PO CHEW
81.0000 mg | CHEWABLE_TABLET | Freq: Every day | ORAL | Status: DC
  Administered 2018-03-13 – 2018-03-16 (×4): 81 mg
  Filled 2018-03-12 (×4): qty 1

## 2018-03-12 MED ORDER — FUROSEMIDE 10 MG/ML IJ SOLN
40.0000 mg | Freq: Three times a day (TID) | INTRAMUSCULAR | Status: AC
  Administered 2018-03-12 (×2): 40 mg via INTRAVENOUS
  Filled 2018-03-12 (×2): qty 4

## 2018-03-12 MED ORDER — VITAL HIGH PROTEIN PO LIQD
1000.0000 mL | ORAL | Status: DC
Start: 2018-03-12 — End: 2018-03-17
  Administered 2018-03-12 – 2018-03-17 (×6): 1000 mL
  Filled 2018-03-12 (×9): qty 1000

## 2018-03-12 NOTE — Consult Note (Signed)
PULMONARY / CRITICAL CARE MEDICINE   Name: Melissa Briggs MRN: [id number] DOB: 06-04-1928    ADMISSION DATE:  02/26/2018 CONSULTATION DATE:  03/11/2018  REFERRING MD:  Denton Brick TRH-MD  CHIEF COMPLAINT:  CAP and respiratory failure  HISTORY OF PRESENT ILLNESS:   84 year old female with extensive PMH who presents to the hospital with SOB and AMS.  Patient was noted to have a PNA at the RUL and hypercarbic respiratory failure.  Patient is in on BiPAP, confused and incoherent so we are unable to get a more detailed history.  2D echo with RV with good function, preliminary lower ext U/S is negative for DVT making this most likely to be pneumonia.  PCCM consulted for respiratory failure.  SUBJECTIVE:  No events overnight, no new changes  VITAL SIGNS: BP (!) 142/65   Pulse 76   Temp 99.2 F (37.3 C) (Axillary)   Resp 18   Ht 5\' 3"  (1.6 m)   Wt 195 lb (88.5 kg)   SpO2 99%   BMI 34.54 kg/m   HEMODYNAMICS: CVP:  [9 mmHg-10 mmHg] 9 mmHg  VENTILATOR SETTINGS: Vent Mode: PRVC FiO2 (%):  [40 %-100 %] 40 % Set Rate:  [18 bmp] 18 bmp Vt Set:  [420 mL] 420 mL PEEP:  [5 cmH20] 5 cmH20 Plateau Pressure:  [19 cmH20-22 cmH20] 22 cmH20  INTAKE / OUTPUT: I/O last 3 completed shifts: In: 1965 [P.O.:240; I.V.:653.8; Blood:721.3; IV Piggyback:350] Out: 1050 [Urine:1050]  PHYSICAL EXAMINATION: General:  Chronically ill appearing female, NAD on vent Neuro:  Agitated on fentanyl 250 mcg and 0.8 of precedex, moving all ext spontaneously HEENT:  [place]/AT, PERRL, EOM-I and MMM Cardiovascular:  RRR, Nl S1/S2 and -M/R/G. Lungs:  Coarse BS diffusely Abdomen:  Soft, NT, ND and +BS Musculoskeletal:  -edema and -tenderness Skin:  Intact  LABS:  BMET Recent Labs  Lab 03/16/2018 2241 03/11/18 0837 03/12/18 0439  NA 139 138 140  K 4.1 4.3 4.3  CL 102 102 106  CO2 27 26 25   BUN 32* 27* 24*  CREATININE 1.09* 0.80 0.69  GLUCOSE 113* 142* 109*   Electrolytes Recent Labs  Lab 03/02/2018 2241  03/11/18 0837 03/12/18 0439  CALCIUM 8.3* 8.0* 7.7*  MG  --   --  2.1  PHOS  --   --  3.0   CBC Recent Labs  Lab 02/16/2018 2241 03/11/18 0446 03/12/18 0002 03/12/18 0439  WBC 8.6  --   --  12.2*  HGB 7.8* 7.6* 8.8* 8.6*  HCT 26.8* 25.6* 28.9* 28.5*  PLT 287  --   --  254   Coag's No results for input(s): APTT, INR in the last 168 hours.  Sepsis Markers Recent Labs  Lab 03/11/18 0837  PROCALCITON 0.10   ABG Recent Labs  Lab 03/11/18 0927 03/11/18 1507 03/12/18 0403  PHART 7.289* 7.303* 7.305*  PCO2ART 62.8* 53.3* 57.5*  PO2ART 101 350* 69.7*   Liver Enzymes Recent Labs  Lab 03/13/2018 2241  AST 30  ALT 17  ALKPHOS 56  BILITOT 0.4  ALBUMIN 3.4*   Cardiac Enzymes Recent Labs  Lab 03/11/18 0708 03/11/18 1314 03/12/18 0002  TROPONINI 0.41* 0.92* 0.94*   Glucose Recent Labs  Lab 03/11/18 0147  GLUCAP 112*   Imaging Dg Abd 1 View  Result Date: 03/11/2018 CLINICAL DATA:  Enteric tube placement EXAM: ABDOMEN - 1 VIEW COMPARISON:  None. FINDINGS: Enteric tube terminates in the gastric fundus. No dilated small bowel loops. Moderate to large stool in the large  bowel. No evidence of pneumatosis or pneumoperitoneum. Levocurvature of the lumbar spine with marked lumbar spondylosis. No radiopaque nephrolithiasis. Superior approach central venous catheter terminates at the cavoatrial junction. IMPRESSION: Enteric tube terminates in the gastric fundus. Nonobstructive bowel gas pattern. Moderate to large colonic stool volume suggests constipation. Electronically Signed   By: Ilona Sorrel M.D.   On: 03/11/2018 12:11   Ct Angio Chest Pe W Or Wo Contrast  Result Date: 03/11/2018 CLINICAL DATA:  Pneumonia respiratory failure EXAM: CT ANGIOGRAPHY CHEST WITH CONTRAST TECHNIQUE: Multidetector CT imaging of the chest was performed using the standard protocol during bolus administration of intravenous contrast. Multiplanar CT image reconstructions and MIPs were obtained to  evaluate the vascular anatomy. CONTRAST:  122mL ISOVUE-370 IOPAMIDOL (ISOVUE-370) INJECTION 76% COMPARISON:  Chest x-ray 03/11/2018, CT chest 11/30/2010 FINDINGS: Cardiovascular: Satisfactory opacification of the pulmonary arteries to the segmental level. No evidence of pulmonary embolism. Aneurysmal dilatation of the ascending aorta, measuring up to 4.4 cm in diameter. Moderate aortic atherosclerosis. Coronary vascular calcification. Cardiomegaly. No large pericardial effusion. Enlarged pulmonary arterial trunk measuring up to 3.7 cm. Mediastinum/Nodes: Midline trachea. Endotracheal tube tip terminates just above the carina. Esophageal tube tip is below the diaphragm but incompletely imaged. No significant mediastinal adenopathy. Lungs/Pleura: Small bilateral pleural effusions. Multifocal consolidations and ground-glass densities within the bilateral lower lobes and bilateral upper lobes. No pneumothorax. Upper Abdomen: No acute abnormality. Musculoskeletal: Degenerative changes. No acute or suspicious bone lesion. Review of the MIP images confirms the above findings. IMPRESSION: 1. Negative for acute pulmonary embolus. 2. Small bilateral pleural effusions with multifocal consolidations and ground-glass densities in the upper and lower lobes consistent with multifocal pneumonia 3. Aneurysmal dilatation of the ascending aorta up to 4.4 cm. Recommend annual imaging followup by CTA or MRA. This recommendation follows 2010 ACCF/AHA/AATS/ACR/ASA/SCA/SCAI/SIR/STS/SVM Guidelines for the Diagnosis and Management of Patients with Thoracic Aortic Disease. Circulation. 2010; 121: I948-N462 4. Cardiomegaly. Enlarged pulmonary arterial trunk raises possibility of pulmonary artery hypertension. Aortic Atherosclerosis (ICD10-I70.0).Aortic aneurysm NOS (ICD10-I71.9). Electronically Signed   By: Donavan Foil M.D.   On: 03/11/2018 22:33   Dg Chest Port 1 View  Result Date: 03/12/2018 CLINICAL DATA:  Hypoxia EXAM: PORTABLE CHEST  1 VIEW COMPARISON:  Chest radiograph and chest CT March 11, 2018 FINDINGS: Endotracheal tube tip is 2.2 cm above the carina. Central catheter tip is in the superior vena cava near the cavoatrial junction. Nasogastric tube tip and side port are in the stomach. No pneumothorax. There is airspace consolidation in the right upper lobe and left base regions. There are small pleural effusions bilaterally. There is cardiomegaly with mild pulmonary venous hypertension. There is aortic atherosclerosis. No adenopathy. No evident bone lesions. IMPRESSION: Tube and catheter positions as described without pneumothorax. Airspace consolidation in the right upper lobe and left base regions. There are layering pleural effusions bilaterally. There is underlying pulmonary vascular congestion. There is aortic atherosclerosis. Aortic Atherosclerosis (ICD10-I70.0). Electronically Signed   By: Lowella Grip III M.D.   On: 03/12/2018 08:04   Dg Chest Port 1 View  Result Date: 03/11/2018 CLINICAL DATA:  Intubated EXAM: PORTABLE CHEST 1 VIEW COMPARISON:  Chest radiograph from earlier today. FINDINGS: Endotracheal tube tip is 2.8 cm above the carina. Enteric tube enters stomach with the tip not seen on this image. Left subclavian central venous catheter terminates at the cavoatrial junction. Surgical clips overlie the right axilla. Stable cardiomediastinal silhouette with mild cardiomegaly. No pneumothorax. Possible small right pleural effusion, stable. No left pleural effusion. Patchy upper parahilar  lung opacities bilaterally, unchanged. IMPRESSION: 1. Well-positioned support structures.  No pneumothorax. 2. Stable mild cardiomegaly with patchy upper parahilar lung opacities, favor pulmonary edema, although the differential includes multilobar pneumonia. 3. Possible stable small right pleural effusion. Electronically Signed   By: Ilona Sorrel M.D.   On: 03/11/2018 12:09   STUDIES:  CXR 3/25>>>Pneumonia at RUL  CULTURES: Blood  3/25>>> Urine 3/25>>> Sputum 3/25>>>  ANTIBIOTICS: Rocephine 3/25>>> Zithromax 3/25>>>  SIGNIFICANT EVENTS:  3/25>>>VDRF on BiPAP  LINES/TUBES: ETT 3/25>>> L [place] TLC 3/25>>>  DISCUSSION: 84 year old female with RUL pneumonia that have had a very poor quality of life for the past 9 months since her MI.  Patient is clearly in respiratory failure and not following commands, discussed with PCCM-NP.  ASSESSMENT / PLAN:  PULMONARY A: VDRF due to PNA.  Essentially ruled out PE at this point.  Had extensive discussion with husband here and daughter over the phone.  Elected to proceed with full code status. P:   - Continue full vent support - Titrate O2 for sat of 88-92% - Adjust vent for ABG - CXR and ABG post intubation  CARDIOVASCULAR A:  Borderline BP P:  - KVO IVF - Active diureses today - Tele monitoring - Hold home anti-HTN  RENAL A:   No active issues P:   - BMET in AM - KVO IVF - Lasix 40 mg IV q8 x2 doses - Replace electrolytes as indicated  GASTROINTESTINAL A:   No active issues P:   - Protonix - TF per nutrition post intubation  HEMATOLOGIC A:   Anemia P:  - Transfuse only if <7 - CBC in AM  INFECTIOUS A:   RUL PNA P:   - Rocephin - Zithromax - F/U on cultures   ENDOCRINE A:   DM   P:   - ISS - CBGs  NEUROLOGIC A:   AMS due to sepsis P:   RASS goal: 0 - Fentanyl drip - Precedex drip  FAMILY  - Updates: Husband updated over the phone, LCB with no CPR/cardioversion.  - Inter-disciplinary family meet or Palliative Care meeting due by:  day 7  The patient is critically ill with multiple organ systems failure and requires high complexity decision making for assessment and support, frequent evaluation and titration of therapies, application of advanced monitoring technologies and extensive interpretation of multiple databases.   Critical Care Time devoted to patient care services described in this note is  105  Minutes. This time  reflects time of care of this signee Dr Jennet Maduro. This critical care time does not reflect procedure time, or teaching time or supervisory time of PA/NP/Med student/Med Resident etc but could involve care discussion time.  Rush Farmer, M.D. [facility] Pulmonary/Critical Care Medicine. Pager: [phone]. After hours pager: [phone].  03/12/2018, 10:18 AM

## 2018-03-12 NOTE — Progress Notes (Signed)
Initial Nutrition Assessment  DOCUMENTATION CODES:   Obesity unspecified  INTERVENTION:   Vital High Protein @ 50 ml/hr Provides: 1200 kcals, 105 grams protein, 1008 ml free water. Meets 100% of calorie and protein needs.   NUTRITION DIAGNOSIS:   Inadequate oral intake related to inability to eat as evidenced by NPO status.  GOAL:   Provide needs based on ASPEN/SCCM guidelines  MONITOR:   Diet advancement, Vent status, Labs, Weight trends, TF tolerance, I & O's  REASON FOR ASSESSMENT:   Consult Enteral/tube feeding initiation and management  ASSESSMENT:   Patient with PMH significant for COPD, HTN, prior CVA, CAD with STEMI s/p pacemaker June 2018, and CKD III. Initially presented this admission with complaints of one week history worsening BLE edema. While on floor, patient developed acute respiratory failure with hypoxia and hypercapnia and respiratory acidosis. BiPAP trial was attempted but patient became confused and coherent and would not keep the BiPAP. She was intubated 3/25 for airway protection.  Pt negative for DVT. Chest X-ray suggestive for bilateral lobe pneumonia. Per prior documentation patient is now LCB with no CPR/cardioversion and no trach PEG.RD consulted for enteral nutrition inititaion. OGT placed during intubation procedure. No family at bedside to provide nutrition history.   No recent weight history present in records.   I/O: +986 ml since admit UOP: 950 ml yesterday- active diureses today  Patient is currently intubated on ventilator support MV: 7.8 L/min Temp (24hrs), Avg:98.2 F (36.8 C), Min:97.5 F (36.4 C), Max:99.2 F (37.3 C) BP: 115/41 MAP: 65 Propofol: None  Medications reviewed and include: IV abx, Protonix, Fentanyl, 40 mg Lasix IV q8 x2 Labs reviewed: BUN 24 (H) Calcium 7.7 (L)  NUTRITION - FOCUSED PHYSICAL EXAM:    Most Recent Value  Orbital Region  No depletion  Upper Arm Region  No depletion  Thoracic and Lumbar Region   Unable to assess  Buccal Region  Unable to assess  Temple Region  Mild depletion  Clavicle Bone Region  No depletion  Clavicle and Acromion Bone Region  No depletion  Scapular Bone Region  Unable to assess  Dorsal Hand  Unable to assess  Patellar Region  No depletion  Anterior Thigh Region  No depletion  Posterior Calf Region  No depletion  Edema (RD Assessment)  Moderate  Hair  Reviewed  Eyes  Unable to assess  Mouth  Unable to assess  Skin  Reviewed  Nails  Reviewed     Diet Order:  Diet Heart Room service appropriate? Yes; Fluid consistency: Thin  EDUCATION NEEDS:   Not appropriate for education at this time  Skin:  Skin Assessment: Reviewed RN Assessment  Last BM:  PTA  Height:   Ht Readings from Last 1 Encounters:  03/11/18 5\' 3"  (1.6 m)    Weight:   Wt Readings from Last 1 Encounters:  02/22/2018 195 lb (88.5 kg)    Ideal Body Weight:  52.2 kg  BMI:  Body mass index is 34.54 kg/m.  Estimated Nutritional Needs:   Kcal:  1000-1250 (11-14 kcal/kg)  Protein:  105-125 g (2-2.3 g/kg IBW)  Fluid:  >1 L/day    Mariana Single RD, LDN Clinical Nutrition Pager # [phone]

## 2018-03-13 ENCOUNTER — Inpatient Hospital Stay (HOSPITAL_COMMUNITY): Payer: Medicare HMO

## 2018-03-13 ENCOUNTER — Ambulatory Visit: Payer: Self-pay | Admitting: Family Medicine

## 2018-03-13 DIAGNOSIS — A419 Sepsis, unspecified organism: Secondary | ICD-10-CM

## 2018-03-13 DIAGNOSIS — R6521 Severe sepsis with septic shock: Secondary | ICD-10-CM

## 2018-03-13 LAB — BLOOD GAS, ARTERIAL
ACID-BASE EXCESS: 4.2 mmol/L — AB (ref 0.0–2.0)
BICARBONATE: 29.9 mmol/L — AB (ref 20.0–28.0)
Drawn by: 232811
FIO2: 40
LHR: 18 {breaths}/min
O2 Saturation: 92.2 %
PEEP/CPAP: 5 cmH2O
PO2 ART: 67.2 mmHg — AB (ref 83.0–108.0)
Patient temperature: 98.2
VT: 420 mL
pCO2 arterial: 54.1 mmHg — ABNORMAL HIGH (ref 32.0–48.0)
pH, Arterial: 7.36 (ref 7.350–7.450)

## 2018-03-13 LAB — CBC
HCT: 26.7 % — ABNORMAL LOW (ref 36.0–46.0)
HEMOGLOBIN: 8.2 g/dL — AB (ref 12.0–15.0)
MCH: 28.1 pg (ref 26.0–34.0)
MCHC: 30.7 g/dL (ref 30.0–36.0)
MCV: 91.4 fL (ref 78.0–100.0)
Platelets: 232 10*3/uL (ref 150–400)
RBC: 2.92 MIL/uL — AB (ref 3.87–5.11)
RDW: 17.4 % — ABNORMAL HIGH (ref 11.5–15.5)
WBC: 9.1 10*3/uL (ref 4.0–10.5)

## 2018-03-13 LAB — GLUCOSE, CAPILLARY
GLUCOSE-CAPILLARY: 140 mg/dL — AB (ref 65–99)
GLUCOSE-CAPILLARY: 163 mg/dL — AB (ref 65–99)
Glucose-Capillary: 135 mg/dL — ABNORMAL HIGH (ref 65–99)
Glucose-Capillary: 142 mg/dL — ABNORMAL HIGH (ref 65–99)
Glucose-Capillary: 150 mg/dL — ABNORMAL HIGH (ref 65–99)
Glucose-Capillary: 154 mg/dL — ABNORMAL HIGH (ref 65–99)
Glucose-Capillary: 94 mg/dL (ref 65–99)

## 2018-03-13 LAB — BASIC METABOLIC PANEL
ANION GAP: 6 (ref 5–15)
BUN: 32 mg/dL — ABNORMAL HIGH (ref 6–20)
CHLORIDE: 103 mmol/L (ref 101–111)
CO2: 30 mmol/L (ref 22–32)
Calcium: 7.8 mg/dL — ABNORMAL LOW (ref 8.9–10.3)
Creatinine, Ser: 0.87 mg/dL (ref 0.44–1.00)
GFR calc Af Amer: >60 mL/min (ref >60)
GFR calc non Af Amer: 58 mL/min — ABNORMAL LOW (ref >60)
GLUCOSE: 161 mg/dL — AB (ref 65–99)
POTASSIUM: 4 mmol/L (ref 3.5–5.1)
SODIUM: 139 mmol/L (ref 135–145)

## 2018-03-13 LAB — MAGNESIUM: MAGNESIUM: 1.9 mg/dL (ref 1.7–2.4)

## 2018-03-13 LAB — PHOSPHORUS: Phosphorus: 3 mg/dL (ref 2.5–4.6)

## 2018-03-13 LAB — STREP PNEUMONIAE URINARY ANTIGEN: Strep Pneumo Urinary Antigen: NEGATIVE

## 2018-03-13 MED ORDER — ATORVASTATIN CALCIUM 40 MG PO TABS
80.0000 mg | ORAL_TABLET | Freq: Every day | ORAL | Status: DC
  Administered 2018-03-13 – 2018-03-16 (×4): 80 mg
  Filled 2018-03-13 (×4): qty 2

## 2018-03-13 MED ORDER — CARVEDILOL 3.125 MG PO TABS
3.1250 mg | ORAL_TABLET | Freq: Two times a day (BID) | ORAL | Status: DC
  Administered 2018-03-13 – 2018-03-15 (×4): 3.125 mg
  Filled 2018-03-13 (×4): qty 1

## 2018-03-13 MED ORDER — CHLORHEXIDINE GLUCONATE CLOTH 2 % EX PADS
6.0000 | MEDICATED_PAD | Freq: Every day | CUTANEOUS | Status: DC
  Administered 2018-03-13 – 2018-03-15 (×3): 6 via TOPICAL

## 2018-03-13 MED ORDER — SODIUM CHLORIDE 0.9% FLUSH
10.0000 mL | Freq: Two times a day (BID) | INTRAVENOUS | Status: DC
  Administered 2018-03-13 – 2018-03-16 (×8): 10 mL

## 2018-03-13 MED ORDER — FUROSEMIDE 10 MG/ML IJ SOLN
40.0000 mg | Freq: Three times a day (TID) | INTRAMUSCULAR | Status: AC
  Administered 2018-03-13 (×2): 40 mg via INTRAVENOUS
  Filled 2018-03-13 (×2): qty 4

## 2018-03-13 MED ORDER — DEXMEDETOMIDINE HCL IN NACL 200 MCG/50ML IV SOLN
0.0000 ug/kg/h | INTRAVENOUS | Status: AC
  Administered 2018-03-14: 1.2 ug/kg/h via INTRAVENOUS
  Administered 2018-03-14 (×2): 1 ug/kg/h via INTRAVENOUS
  Filled 2018-03-13 (×3): qty 50

## 2018-03-13 MED ORDER — BUDESONIDE 0.5 MG/2ML IN SUSP
0.5000 mg | Freq: Two times a day (BID) | RESPIRATORY_TRACT | Status: DC
  Administered 2018-03-13 – 2018-03-17 (×9): 0.5 mg via RESPIRATORY_TRACT
  Filled 2018-03-13 (×8): qty 2

## 2018-03-13 MED ORDER — DEXMEDETOMIDINE HCL IN NACL 400 MCG/100ML IV SOLN
0.0000 ug/kg/h | INTRAVENOUS | Status: DC
  Administered 2018-03-13: 0.9 ug/kg/h via INTRAVENOUS
  Administered 2018-03-13: 0.8 ug/kg/h via INTRAVENOUS
  Administered 2018-03-13: 0.9 ug/kg/h via INTRAVENOUS
  Filled 2018-03-13 (×4): qty 100

## 2018-03-13 MED ORDER — POTASSIUM CHLORIDE 20 MEQ/15ML (10%) PO SOLN
20.0000 meq | Freq: Once | ORAL | Status: AC
  Administered 2018-03-13: 20 meq
  Filled 2018-03-13: qty 15

## 2018-03-13 MED ORDER — LIP MEDEX EX OINT
TOPICAL_OINTMENT | CUTANEOUS | Status: AC
  Administered 2018-03-13: 1
  Filled 2018-03-13: qty 7

## 2018-03-13 MED ORDER — SODIUM CHLORIDE 0.9% FLUSH: 10.0000 mL | INTRAVENOUS | Status: DC | PRN

## 2018-03-13 NOTE — Progress Notes (Addendum)
PULMONARY / CRITICAL CARE MEDICINE   Name: Melissa Briggs MRN: [id number] DOB: 1928-01-06    ADMISSION DATE:  03/12/2018 CONSULTATION DATE:  03/11/2018  REFERRING MD:  Denton Brick TRH-MD  CHIEF COMPLAINT:  CAP and respiratory failure  BRIEF SUMMARY:   84 year old female with extensive PMH who presents to the hospital with SOB and AMS.  Patient was noted to have a PNA at the RUL and hypercarbic respiratory failure.  Patient initially required BiPAP, confused and incoherent.  2D echo with RV with good function, preliminary lower ext U/S is negative for DVT making this most likely to be pneumonia.  PCCM consulted for respiratory failure.  Developed progressive respiratory failure > intubated 3/25.    SUBJECTIVE:  1.8L UOP with lasix in last 24 hours.  Tmax 99.1 / WBC 9.1.  RN reports no acute events overnight.  Weaning sedation.    VITAL SIGNS: BP (!) 136/53   Pulse 66   Temp 99.1 F (37.3 C) (Oral)   Resp 18   Ht 5\' 3"  (1.6 m)   Wt 216 lb 14.9 oz (98.4 kg)   SpO2 98%   BMI 38.43 kg/m   HEMODYNAMICS:    VENTILATOR SETTINGS: Vent Mode: PRVC FiO2 (%):  [40 %] 40 % Set Rate:  [18 bmp] 18 bmp Vt Set:  [420 mL] 420 mL PEEP:  [5 cmH20] 5 cmH20 Plateau Pressure:  [19 cmH20-25 cmH20] 23 cmH20  INTAKE / OUTPUT: I/O last 3 completed shifts: In: 2768.5 [I.V.:1364.7; Blood:721.3; NG/GT:332.5; IV Piggyback:350] Out: 2325 [Urine:2325]  PHYSICAL EXAMINATION: General: elderly female in NAD on vent    HEENT: MM pink/moist, ETT Neuro: opens eyes to voice, speech clear, MAE  CV: s1s2 rrr, no m/r/g PULM: even/non-labored, lungs bilaterally coarse EH:UDJS, non-tender, bsx4 active  Extremities: warm/dry, 1-2+ generalized edema  Skin: thin, fragile skin   LABS:  BMET Recent Labs  Lab 03/11/18 0837 03/12/18 0439 03/13/18 0418  NA 138 140 139  K 4.3 4.3 4.0  CL 102 106 103  CO2 26 25 30   BUN 27* 24* 32*  CREATININE 0.80 0.69 0.87  GLUCOSE 142* 109* 161*   Electrolytes Recent  Labs  Lab 03/11/18 0837 03/12/18 0439 03/13/18 0418  CALCIUM 8.0* 7.7* 7.8*  MG  --  2.1 1.9  PHOS  --  3.0 3.0   CBC Recent Labs  Lab 03/02/2018 2241  03/12/18 0002 03/12/18 0439 03/13/18 0418  WBC 8.6  --   --  12.2* 9.1  HGB 7.8*   < > 8.8* 8.6* 8.2*  HCT 26.8*   < > 28.9* 28.5* 26.7*  PLT 287  --   --  254 232   < > = values in this interval not displayed.   Coag's No results for input(s): APTT, INR in the last 168 hours.  Sepsis Markers Recent Labs  Lab 03/11/18 0837  PROCALCITON 0.10   ABG Recent Labs  Lab 03/11/18 1507 03/12/18 0403 03/13/18 0317  PHART 7.303* 7.305* 7.360  PCO2ART 53.3* 57.5* 54.1*  PO2ART 350* 69.7* 67.2*   Liver Enzymes Recent Labs  Lab 02/28/2018 2241  AST 30  ALT 17  ALKPHOS 56  BILITOT 0.4  ALBUMIN 3.4*   Cardiac Enzymes Recent Labs  Lab 03/11/18 0708 03/11/18 1314 03/12/18 0002  TROPONINI 0.41* 0.92* 0.94*   Glucose Recent Labs  Lab 03/12/18 1220 03/12/18 1654 03/12/18 1947 03/13/18 0015 03/13/18 0311 03/13/18 0743  GLUCAP 123* 102* 123* 142* 135* 140*   Imaging Dg Chest Port 1 [address]  Result Date: 03/13/2018 CLINICAL DATA:  ET tube position EXAM: PORTABLE CHEST 1 VIEW COMPARISON:  03/12/2018 FINDINGS: ET tube is in stable position approximately 3.4 cm above the carina. NG tube is in the distal esophagus or proximal stomach. Cardiomegaly. Elevation of the right hemidiaphragm, new since prior study. Bilateral perihilar and lower lobe opacities could reflect edema or infection. Suspect layering left effusion. IMPRESSION: Bilateral perihilar and lower lobe opacities, edema versus infection. Layering left effusion. Elevation of the right hemidiaphragm. Electronically Signed   By: Rolm Baptise M.D.   On: 03/13/2018 09:34   STUDIES:  CXR 3/25 >> Pneumonia at RUL  CULTURES: Blood 3/25 >> Urine 3/25 >> multiple species, recollect suggested Sputum 3/25 >>  ANTIBIOTICS: Rocephine 3/25 >> Zithromax 3/25  >>  SIGNIFICANT EVENTS:  3/25 VDRF on BiPAP, failed / intubated   LINES/TUBES: ETT 3/25 >> L [place] TLC 3/25 >>  DISCUSSION: 84 year old female admitted 3/25 with RUL pneumonia. Per husband, she has had a very poor quality of life over the last 9 months since her MI.  Failed BiPAP and required intubation 3/25.  Made LCB after intubation.     ASSESSMENT / PLAN:  PULMONARY A: VDRF due to PNA - PE ruled out  COPD  P:   PRVC 8 cc/kg  Wean PEEP / FiO2 for sats 88-93% Follow intermittent CXR Discontinue Breo Continue Duoneb + pulmicort  Daily SBT / WUA   CARDIOVASCULAR A:  Borderline BP - resolved RBBB - known  P:  ICU monitoring  Discontinue pressors from [facility] Continue lisinopril, coreg   RENAL A:   No active issues P:   Trend BMP / urinary output Replace electrolytes as indicated Avoid nephrotoxic agents, ensure adequate renal perfusion Repeat lasix 3/27 + KCL  GASTROINTESTINAL A:   No active issues P:   Protonix for SUP  TF per Nutrition   HEMATOLOGIC A:   Anemia P:  Trend CBC  Transfuse per ICU guidelines   INFECTIOUS A:   RUL PNA P:   Continue abx as above > rocephin + azithro  Follow cultures   ENDOCRINE A:   DM Hx Acquired Hypothyroidism    P:   SSI   NEUROLOGIC A:   Acute Encephalopathy - in setting of sepsis  P:   RASS goal: 0 to -1  Fentanyl gtt for pain  Precedex for sedation   FAMILY  - Updates: Husband updated over the phone, LCB with no CPR/cardioversion.  CC Time: 50 minutes   Noe Gens, NP-C [place] [facility] Pgr: [phone] or if no answer [phone] 03/13/2018, 9:55 AM  Attending Note:  84 year old female with CAP and VDRF who is now off pressors and started on her anti-HTN.  On exam, she is either combative or completely sedate.  I reviewed CXR myself, pulmonary infiltrate noted and some pulmonary edema with ETT in good position.  Will start some diureses today.  Continue abx.  Begin PS trials as able.   Minimize sedation. PCCM will continue to follow.  The patient is critically ill with multiple organ systems failure and requires high complexity decision making for assessment and support, frequent evaluation and titration of therapies, application of advanced monitoring technologies and extensive interpretation of multiple databases.   Critical Care Time devoted to patient care services described in this note is  35  Minutes. This time reflects time of care of this signee Dr Jennet Maduro. This critical care time does not reflect procedure time, or teaching time  or supervisory time of PA/NP/Med student/Med Resident etc but could involve care discussion time.  Rush Farmer, M.D. [facility] Pulmonary/Critical Care Medicine. Pager: [phone]. After hours pager: [phone].

## 2018-03-14 ENCOUNTER — Inpatient Hospital Stay (HOSPITAL_COMMUNITY): Payer: Medicare HMO

## 2018-03-14 LAB — BASIC METABOLIC PANEL
Anion gap: 8 (ref 5–15)
BUN: 37 mg/dL — ABNORMAL HIGH (ref 6–20)
CO2: 32 mmol/L (ref 22–32)
Calcium: 7.7 mg/dL — ABNORMAL LOW (ref 8.9–10.3)
Chloride: 101 mmol/L (ref 101–111)
Creatinine, Ser: 0.72 mg/dL (ref 0.44–1.00)
GLUCOSE: 164 mg/dL — AB (ref 65–99)
POTASSIUM: 3.6 mmol/L (ref 3.5–5.1)
Sodium: 141 mmol/L (ref 135–145)

## 2018-03-14 LAB — CBC
HEMATOCRIT: 27.7 % — AB (ref 36.0–46.0)
Hemoglobin: 8.5 g/dL — ABNORMAL LOW (ref 12.0–15.0)
MCH: 28.1 pg (ref 26.0–34.0)
MCHC: 30.7 g/dL (ref 30.0–36.0)
MCV: 91.7 fL (ref 78.0–100.0)
Platelets: 233 10*3/uL (ref 150–400)
RBC: 3.02 MIL/uL — AB (ref 3.87–5.11)
RDW: 17.3 % — ABNORMAL HIGH (ref 11.5–15.5)
WBC: 9.7 10*3/uL (ref 4.0–10.5)

## 2018-03-14 LAB — GLUCOSE, CAPILLARY
GLUCOSE-CAPILLARY: 134 mg/dL — AB (ref 65–99)
GLUCOSE-CAPILLARY: 138 mg/dL — AB (ref 65–99)
GLUCOSE-CAPILLARY: 145 mg/dL — AB (ref 65–99)
Glucose-Capillary: 148 mg/dL — ABNORMAL HIGH (ref 65–99)
Glucose-Capillary: 119 mg/dL — ABNORMAL HIGH (ref 65–99)
Glucose-Capillary: 109 mg/dL — ABNORMAL HIGH (ref 65–99)

## 2018-03-14 MED ORDER — MIDAZOLAM HCL 2 MG/2ML IJ SOLN
2.0000 mg | Freq: Once | INTRAMUSCULAR | Status: AC
  Administered 2018-03-14: 2 mg via INTRAVENOUS

## 2018-03-14 MED ORDER — POTASSIUM CHLORIDE 20 MEQ/15ML (10%) PO SOLN
40.0000 meq | Freq: Three times a day (TID) | ORAL | Status: AC
Start: 2018-03-14 — End: 2018-03-14
  Administered 2018-03-14 (×2): 40 meq
  Filled 2018-03-14 (×2): qty 30

## 2018-03-14 MED ORDER — FUROSEMIDE 10 MG/ML IJ SOLN
20.0000 mg | Freq: Four times a day (QID) | INTRAMUSCULAR | Status: AC
  Administered 2018-03-14 (×3): 20 mg via INTRAVENOUS
  Filled 2018-03-14 (×3): qty 2

## 2018-03-14 MED ORDER — FENTANYL BOLUS VIA INFUSION
25.0000 ug | INTRAVENOUS | Status: DC | PRN
  Administered 2018-03-14 – 2018-03-15 (×6): 50 ug via INTRAVENOUS
  Filled 2018-03-14: qty 50

## 2018-03-14 MED ORDER — AZITHROMYCIN 250 MG PO TABS
500.0000 mg | ORAL_TABLET | Freq: Every day | ORAL | Status: DC
  Administered 2018-03-15 – 2018-03-16 (×2): 500 mg
  Filled 2018-03-14 (×2): qty 2

## 2018-03-14 MED ORDER — INSULIN ASPART 100 UNIT/ML ~~LOC~~ SOLN
0.0000 [IU] | SUBCUTANEOUS | Status: DC
  Administered 2018-03-14 (×2): 2 [IU] via SUBCUTANEOUS
  Administered 2018-03-14: 3 [IU] via SUBCUTANEOUS
  Administered 2018-03-14 – 2018-03-15 (×5): 2 [IU] via SUBCUTANEOUS
  Administered 2018-03-15 – 2018-03-16 (×3): 3 [IU] via SUBCUTANEOUS
  Administered 2018-03-16: 5 [IU] via SUBCUTANEOUS
  Administered 2018-03-16: 2 [IU] via SUBCUTANEOUS
  Administered 2018-03-16 (×3): 3 [IU] via SUBCUTANEOUS
  Administered 2018-03-17: 2 [IU] via SUBCUTANEOUS
  Administered 2018-03-17: 3 [IU] via SUBCUTANEOUS

## 2018-03-14 MED ORDER — MIDAZOLAM HCL 2 MG/2ML IJ SOLN
INTRAMUSCULAR | Status: AC
  Filled 2018-03-14: qty 2

## 2018-03-14 NOTE — Care Management Note (Signed)
Case Management Note  Patient Details  Name: Melissa Briggs MRN: [id number] Date of Birth: 1928/03/06  Subjective/Objective: 84 y/o f admitted w/CAP, Respiratory failure. From home. Partial code.Failed bipap. On vent.                   Action/Plan:d/c plan home w/HHC.   Expected Discharge Date:                  Expected Discharge Plan:  [place]  In-House Referral:     Discharge planning Services  CM Consult  Post Acute Care Choice:    Choice offered to:     DME Arranged:    DME Agency:     HH Arranged:    [place] Agency:     Status of Service:  In process, will continue to follow  If discussed at Long Length of Stay Meetings, dates discussed:    Additional Comments:  Dessa Phi, RN 03/14/2018, 10:44 AM

## 2018-03-14 NOTE — Progress Notes (Signed)
PHARMACIST - PHYSICIAN COMMUNICATION DR:   Nelda Marseille CONCERNING: Antibiotic IV to Oral Route Change Policy  RECOMMENDATION: This patient is receiving Azithromycin by the intravenous route.  Based on criteria approved by the Pharmacy and Therapeutics Committee, the antibiotic(s) is/are being converted to the equivalent oral dose form(s).   DESCRIPTION: These criteria include:  Patient being treated for a respiratory tract infection, urinary tract infection, cellulitis or clostridium difficile associated diarrhea if on metronidazole  The patient is not neutropenic and does not exhibit a GI malabsorption state  The patient is eating (either orally or via tube) and/or has been taking other orally administered medications for a least 24 hours  The patient is improving clinically and has a Tmax < 100.5  If you have questions about this conversion, please contact the Pharmacy Department  []   [phone] )  [facility] []   [phone] )  [facility] []   [phone] )  [facility] []   [phone] )  [facility] [x]   [phone] )  [place], PharmD, BCPS Pager: [phone] 03/14/2018 12:54 PM

## 2018-03-14 NOTE — Progress Notes (Signed)
PULMONARY / CRITICAL CARE MEDICINE   Name: Melissa Briggs MRN: [id number] DOB: 1927-12-30    ADMISSION DATE:  03/01/2018 CONSULTATION DATE:  03/11/2018  REFERRING MD:  Denton Brick TRH-MD  CHIEF COMPLAINT:  CAP and respiratory failure  BRIEF SUMMARY:   84 year old female with extensive PMH who presents to the hospital with SOB and AMS.  Patient was noted to have a PNA at the RUL and hypercarbic respiratory failure.  Patient initially required BiPAP, confused and incoherent.  2D echo with RV with good function, preliminary lower ext U/S is negative for DVT making this most likely to be pneumonia.  PCCM consulted for respiratory failure.  Developed progressive respiratory failure > intubated 3/25.    SUBJECTIVE:   No events overnight, no new complaints, weaning  VITAL SIGNS: BP (!) 105/58   Pulse 65   Temp 98.4 F (36.9 C) (Axillary)   Resp 18   Ht 5\' 3"  (1.6 m)   Wt 211 lb 6.7 oz (95.9 kg)   SpO2 96%   BMI 37.45 kg/m   HEMODYNAMICS:    VENTILATOR SETTINGS: Vent Mode: PSV;CPAP FiO2 (%):  [30 %-40 %] 30 % Set Rate:  [18 bmp] 18 bmp Vt Set:  [420 mL] 420 mL PEEP:  [5 cmH20] 5 cmH20 Pressure Support:  [5 cmH20] 5 cmH20 Plateau Pressure:  [17 cmH20-23 cmH20] 20 cmH20  INTAKE / OUTPUT: I/O last 3 completed shifts: In: 2664.3 [I.V.:1079.3; NG/GT:1335; IV Piggyback:250] Out: 4450 [Urine:4450]  PHYSICAL EXAMINATION: General: Elderly female, on vent, agitated HEENT: MM pink/moist, ETT Neuro: Opens eyes, moving all ext to commnad CV: RRR, Nl S1/S2 and -M/R/G. PULM: Coarse BS diffusely GI: Soft, NT, ND and +BS Extremities: warm/dry, 1-2+ generalized edema  Skin: thin, fragile skin   LABS:  BMET Recent Labs  Lab 03/12/18 0439 03/13/18 0418 03/14/18 0502  NA 140 139 141  K 4.3 4.0 3.6  CL 106 103 101  CO2 25 30 32  BUN 24* 32* 37*  CREATININE 0.69 0.87 0.72  GLUCOSE 109* 161* 164*   Electrolytes Recent Labs  Lab 03/12/18 0439 03/13/18 0418 03/14/18 0502   CALCIUM 7.7* 7.8* 7.7*  MG 2.1 1.9  --   PHOS 3.0 3.0  --    CBC Recent Labs  Lab 03/12/18 0439 03/13/18 0418 03/14/18 0502  WBC 12.2* 9.1 9.7  HGB 8.6* 8.2* 8.5*  HCT 28.5* 26.7* 27.7*  PLT 254 232 233   Coag's No results for input(s): APTT, INR in the last 168 hours.  Sepsis Markers Recent Labs  Lab 03/11/18 0837  PROCALCITON 0.10   ABG Recent Labs  Lab 03/11/18 1507 03/12/18 0403 03/13/18 0317  PHART 7.303* 7.305* 7.360  PCO2ART 53.3* 57.5* 54.1*  PO2ART 350* 69.7* 67.2*   Liver Enzymes Recent Labs  Lab 02/26/2018 2241  AST 30  ALT 17  ALKPHOS 56  BILITOT 0.4  ALBUMIN 3.4*   Cardiac Enzymes Recent Labs  Lab 03/11/18 0708 03/11/18 1314 03/12/18 0002  TROPONINI 0.41* 0.92* 0.94*   Glucose Recent Labs  Lab 03/13/18 1305 03/13/18 1604 03/13/18 2122 03/13/18 2324 03/14/18 0327 03/14/18 0827  GLUCAP 150* 94 154* 163* 148* 138*   Imaging Dg Chest Port 1 View  Result Date: 03/14/2018 CLINICAL DATA:  Acute respiratory failure with hypoxia EXAM: PORTABLE CHEST 1 VIEW COMPARISON:  03/13/2018 FINDINGS: Endotracheal tube in good position. NG tube tip not well seen but may be in the distal esophagus. Bibasilar airspace disease and bilateral effusions unchanged. Pulmonary vascular congestion unchanged. Left  subclavian central venous catheter tip in the SVC is unchanged. IMPRESSION: Bibasilar airspace disease and bilateral effusions unchanged. Pulmonary vascular congestion remains. Endotracheal tube and central line in good position. NG tip appears to be in the distal esophagus. Electronically Signed   By: Franchot Gallo M.D.   On: 03/14/2018 07:28   STUDIES:  CXR 3/25 >> Pneumonia at RUL  CULTURES: Blood 3/25 >> Urine 3/25 >> multiple species, recollect suggested Sputum 3/25 >>  ANTIBIOTICS: Rocephine 3/25 >> Zithromax 3/25 >>  SIGNIFICANT EVENTS:  3/25 VDRF on BiPAP, failed / intubated   LINES/TUBES: ETT 3/25 >> L [place] TLC 3/25  >>  DISCUSSION: 84 year old female admitted 3/25 with RUL pneumonia. Per husband, she has had a very poor quality of life over the last 9 months since her MI.  Failed BiPAP and required intubation 3/25.  Made LCB after intubation.     ASSESSMENT / PLAN:  PULMONARY A: VDRF due to PNA - PE ruled out  COPD  P:   Begin PS trials but no extubation yet given mental status Wean PEEP / FiO2 for sats 88-93% CXR and ABG daily Discontinue Breo Continue Duoneb + pulmicort  Daily SBT / WUA   CARDIOVASCULAR A:  Borderline BP - resolved RBBB - known  P:  ICU monitoring  Discontinue pressors from [facility] Continue lisinopril, coreg  Tele monitoring  RENAL A:   No active issues P:   Trend BMP / urinary output Replace electrolytes as indicated Avoid nephrotoxic agents, ensure adequate renal perfusion Lasix 40 mg IV q6 x3 doses Potassium PO  GASTROINTESTINAL A:   No active issues P:   Protonix for SUP  TF per Nutrition once OGT is verified  HEMATOLOGIC A:   Anemia P:  Trend CBC  Transfuse per ICU guidelines   INFECTIOUS A:   RUL PNA P:   Continue abx as above > rocephin + azithro  Follow cultures   ENDOCRINE A:   DM Hx Acquired Hypothyroidism    P:   SSI   NEUROLOGIC A:   Acute Encephalopathy - in setting of sepsis  P:   RASS goal: 0 to -1  Fentanyl gtt for pain  Precedex for sedation   FAMILY  - Updates: Daughter updated at length bedside, no CPR/cardioversion, not trach/peg once extubated the no reintubation  The patient is critically ill with multiple organ systems failure and requires high complexity decision making for assessment and support, frequent evaluation and titration of therapies, application of advanced monitoring technologies and extensive interpretation of multiple databases.   Critical Care Time devoted to patient care services described in this note is  35  Minutes. This time reflects time of care of this signee Dr Jennet Maduro. This  critical care time does not reflect procedure time, or teaching time or supervisory time of PA/NP/Med student/Med Resident etc but could involve care discussion time.  Rush Farmer, M.D. [facility] Pulmonary/Critical Care Medicine. Pager: [phone]. After hours pager: [phone].

## 2018-03-15 ENCOUNTER — Inpatient Hospital Stay (HOSPITAL_COMMUNITY): Payer: Medicare HMO

## 2018-03-15 LAB — BLOOD GAS, ARTERIAL
Acid-Base Excess: 8.8 mmol/L — ABNORMAL HIGH (ref 0.0–2.0)
Bicarbonate: 34.6 mmol/L — ABNORMAL HIGH (ref 20.0–28.0)
Drawn by: 232811
FIO2: 30
LHR: 18 {breaths}/min
O2 SAT: 91.9 %
PATIENT TEMPERATURE: 99.1
PCO2 ART: 59 mmHg — AB (ref 32.0–48.0)
PEEP: 5 cmH2O
PH ART: 7.387 (ref 7.350–7.450)
PO2 ART: 67 mmHg — AB (ref 83.0–108.0)
VT: 420 mL

## 2018-03-15 LAB — CBC
HEMATOCRIT: 30.2 % — AB (ref 36.0–46.0)
HEMOGLOBIN: 9 g/dL — AB (ref 12.0–15.0)
MCH: 27.1 pg (ref 26.0–34.0)
MCHC: 29.8 g/dL — AB (ref 30.0–36.0)
MCV: 91 fL (ref 78.0–100.0)
Platelets: 262 10*3/uL (ref 150–400)
RBC: 3.32 MIL/uL — ABNORMAL LOW (ref 3.87–5.11)
RDW: 17.1 % — AB (ref 11.5–15.5)
WBC: 15.1 10*3/uL — ABNORMAL HIGH (ref 4.0–10.5)

## 2018-03-15 LAB — BASIC METABOLIC PANEL
Anion gap: 10 (ref 5–15)
BUN: 39 mg/dL — ABNORMAL HIGH (ref 6–20)
CHLORIDE: 101 mmol/L (ref 101–111)
CO2: 34 mmol/L — AB (ref 22–32)
CREATININE: 0.87 mg/dL (ref 0.44–1.00)
Calcium: 8 mg/dL — ABNORMAL LOW (ref 8.9–10.3)
GFR calc non Af Amer: 58 mL/min — ABNORMAL LOW (ref >60)
GLUCOSE: 138 mg/dL — AB (ref 65–99)
Potassium: 4.3 mmol/L (ref 3.5–5.1)
Sodium: 145 mmol/L (ref 135–145)

## 2018-03-15 LAB — GLUCOSE, CAPILLARY
GLUCOSE-CAPILLARY: 123 mg/dL — AB (ref 65–99)
GLUCOSE-CAPILLARY: 147 mg/dL — AB (ref 65–99)
GLUCOSE-CAPILLARY: 161 mg/dL — AB (ref 65–99)
Glucose-Capillary: 142 mg/dL — ABNORMAL HIGH (ref 65–99)
Glucose-Capillary: 150 mg/dL — ABNORMAL HIGH (ref 65–99)

## 2018-03-15 LAB — MAGNESIUM: Magnesium: 1.8 mg/dL (ref 1.7–2.4)

## 2018-03-15 LAB — PHOSPHORUS: Phosphorus: 2.6 mg/dL (ref 2.5–4.6)

## 2018-03-15 MED ORDER — POTASSIUM CHLORIDE 20 MEQ/15ML (10%) PO SOLN
40.0000 meq | Freq: Once | ORAL | Status: AC
  Administered 2018-03-15: 40 meq
  Filled 2018-03-15: qty 30

## 2018-03-15 MED ORDER — FUROSEMIDE 10 MG/ML IJ SOLN
20.0000 mg | Freq: Four times a day (QID) | INTRAMUSCULAR | Status: AC
  Administered 2018-03-15 (×3): 20 mg via INTRAVENOUS
  Filled 2018-03-15 (×3): qty 2

## 2018-03-15 MED ORDER — CARVEDILOL 6.25 MG PO TABS
6.2500 mg | ORAL_TABLET | Freq: Two times a day (BID) | ORAL | Status: DC
  Administered 2018-03-15 – 2018-03-16 (×3): 6.25 mg
  Filled 2018-03-15 (×3): qty 1

## 2018-03-15 MED ORDER — FENTANYL CITRATE (PF) 100 MCG/2ML IJ SOLN
50.0000 ug | INTRAMUSCULAR | Status: DC | PRN
  Administered 2018-03-15 (×2): 50 ug via INTRAVENOUS
  Filled 2018-03-15 (×3): qty 2

## 2018-03-15 MED ORDER — MIDAZOLAM HCL 2 MG/2ML IJ SOLN
INTRAMUSCULAR | Status: AC
  Filled 2018-03-15: qty 2

## 2018-03-15 MED ORDER — DEXMEDETOMIDINE HCL IN NACL 200 MCG/50ML IV SOLN
0.0000 ug/kg/h | INTRAVENOUS | Status: DC
  Administered 2018-03-15 (×3): 1.2 ug/kg/h via INTRAVENOUS
  Administered 2018-03-15 (×2): 0.4 ug/kg/h via INTRAVENOUS
  Administered 2018-03-16 (×2): 1.5 ug/kg/h via INTRAVENOUS
  Administered 2018-03-16: 0.8 ug/kg/h via INTRAVENOUS
  Administered 2018-03-16 (×4): 1.5 ug/kg/h via INTRAVENOUS
  Administered 2018-03-16: 1.2 ug/kg/h via INTRAVENOUS
  Administered 2018-03-16: 1.5 ug/kg/h via INTRAVENOUS
  Administered 2018-03-16: 0.8 ug/kg/h via INTRAVENOUS
  Filled 2018-03-15 (×17): qty 50

## 2018-03-15 MED ORDER — MIDAZOLAM HCL 2 MG/2ML IJ SOLN
1.0000 mg | INTRAMUSCULAR | Status: DC | PRN
  Administered 2018-03-15 – 2018-03-17 (×9): 1 mg via INTRAVENOUS
  Filled 2018-03-15 (×8): qty 2

## 2018-03-15 NOTE — Progress Notes (Signed)
Nutrition Follow-up  DOCUMENTATION CODES:   Obesity unspecified  INTERVENTION:  - Continue Vital High Protein @ 50 mL/hr. - If free water flush desired, to be per MD/NP.   NUTRITION DIAGNOSIS:   Inadequate oral intake related to inability to eat as evidenced by NPO status. -ongoing  GOAL:   Provide needs based on ASPEN/SCCM guidelines -met with TF regimen  MONITOR:   Vent status, TF tolerance, Weight trends, Labs  ASSESSMENT:   Patient with PMH significant for COPD, HTN, prior CVA, CAD with STEMI s/p pacemaker June 2018, and CKD III. Initially presented this admission with complaints of one week history worsening BLE edema. While on floor, patient developed acute respiratory failure with hypoxia and hypercapnia and respiratory acidosis. BiPAP trial was attempted but patient became confused and coherent and would not keep the BiPAP. She was intubated 3/25 for airway protection.   Weight +4 lbs/1.4 kg compared to weight on 3/27. Estimated nutrition needs from 3/26 remain appropriate. Pt remains intubated with OGT in place. She is receiving Vital High Protein @ 50 mL/hr which is providing 1200 kcal, 105 grams of protein, and 1003 mL free water.   Per Dr. Pura Spice note this AM: PE ruled out, VDRF d/t PNA. Discussed with family previously and family states "no CPR/cardioversion. No trach/PEG, once extubated no reintubation."   Patient is currently intubated on ventilator support MV: 8.3 L/min Temp (24hrs), Avg:99.4 F (37.4 C), Min:97.7 F (36.5 C), Max:102 F (38.9 C) Propofol: none BP: 171/62 and MAP: 101  Medications reviewed; 20 mg IV Lasix QID yesterday and today, sliding scale Novolog, 40 mEq KCl per OGT x1 dose today and x2 doses yesterday. Labs reviewed; CBG: 142 mg/dL this AM, BUN: 39 mg/dL, Ca: 8 mg/dL.  Drip: Precedex @ 0.4 mcg/kg/hr.       Diet Order:  No diet orders on file  EDUCATION NEEDS:   Not appropriate for education at this time  Skin:  Skin  Assessment: Reviewed RN Assessment  Last BM:  PTA/unknown  Height:   Ht Readings from Last 1 Encounters:  03/13/18 '5\' 3"'  (1.6 m)    Weight:   Wt Readings from Last 1 Encounters:  03/15/18 220 lb 0.3 oz (99.8 kg)    Ideal Body Weight:  52.2 kg  BMI:  Body mass index is 38.97 kg/m.  Estimated Nutritional Needs:   Kcal:  1000-1250 (11-14 kcal/kg)  Protein:  105-125 g (2-2.3 g/kg IBW)  Fluid:  >1 L/day      Jarome Matin, MS, RD, LDN, [facility] Inpatient Clinical Dietitian Pager # [phone] After hours/weekend pager # [phone]

## 2018-03-15 NOTE — Progress Notes (Addendum)
PULMONARY / CRITICAL CARE MEDICINE   Name: Melissa Briggs MRN: [id number] DOB: May 27, 1928    ADMISSION DATE:  03/15/2018 CONSULTATION DATE:  03/11/2018  REFERRING MD:  Denton Brick TRH-MD  CHIEF COMPLAINT:  CAP and respiratory failure  BRIEF SUMMARY:   84 year old female with extensive PMH who presents to the hospital with SOB and AMS.  Patient was noted to have a PNA at the RUL and hypercarbic respiratory failure.  Patient initially required BiPAP, confused and incoherent.  2D echo with RV with good function, preliminary lower ext U/S is negative for DVT making this most likely to be pneumonia.  PCCM consulted for respiratory failure.  Developed progressive respiratory failure > intubated 3/25.    SUBJECTIVE:   RN reports pt weaned from 10-2pm yesterday then desaturated.  PSV attempt this am with HR increasing to 140's.  Tmax 102.  WBC 15.1.  3.1 L UOP in last 24 hours / net neg 1.9L for admit.  VITAL SIGNS: BP (!) 177/88   Pulse (!) 126   Temp 97.7 F (36.5 C) (Axillary)   Resp 18   Ht 5\' 3"  (1.6 m)   Wt 220 lb 0.3 oz (99.8 kg)   SpO2 98%   BMI 38.97 kg/m   HEMODYNAMICS:    VENTILATOR SETTINGS: Vent Mode: PRVC FiO2 (%):  [30 %-40 %] 30 % Set Rate:  [18 bmp] 18 bmp Vt Set:  [420 mL] 420 mL PEEP:  [5 cmH20] 5 cmH20 Pressure Support:  [5 cmH20] 5 cmH20 Plateau Pressure:  [19 cmH20-29 cmH20] 19 cmH20  INTAKE / OUTPUT: I/O last 3 completed shifts: In: 2433.3 [I.V.:583.3; NG/GT:1400; IV Piggyback:450] Out: 0109 [Urine:4725]  PHYSICAL EXAMINATION: General:  Elderly female in NAD on vent HEENT: MM pink/moist, ETT, thick oral secretions Neuro: Awakens, eyes open/tracks, MAE  CV: s1s2 rrr, no m/r/g PULM: even/non-labored, lungs bilaterally coarse  NA:TFTD, non-tender, bsx4 active  Extremities: warm/dry, 1+ generalized edema  Skin: no rashes or lesions, thin / fragile skin   LABS:  BMET Recent Labs  Lab 03/13/18 0418 03/14/18 0502 03/15/18 0527  NA 139 141 145  K  4.0 3.6 4.3  CL 103 101 101  CO2 30 32 34*  BUN 32* 37* 39*  CREATININE 0.87 0.72 0.87  GLUCOSE 161* 164* 138*   Electrolytes Recent Labs  Lab 03/12/18 0439 03/13/18 0418 03/14/18 0502 03/15/18 0527  CALCIUM 7.7* 7.8* 7.7* 8.0*  MG 2.1 1.9  --  1.8  PHOS 3.0 3.0  --  2.6   CBC Recent Labs  Lab 03/13/18 0418 03/14/18 0502 03/15/18 0527  WBC 9.1 9.7 15.1*  HGB 8.2* 8.5* 9.0*  HCT 26.7* 27.7* 30.2*  PLT 232 233 262   Coag's No results for input(s): APTT, INR in the last 168 hours.  Sepsis Markers Recent Labs  Lab 03/11/18 0837  PROCALCITON 0.10   ABG Recent Labs  Lab 03/12/18 0403 03/13/18 0317 03/15/18 0415  PHART 7.305* 7.360 7.387  PCO2ART 57.5* 54.1* 59.0*  PO2ART 69.7* 67.2* 67.0*   Liver Enzymes Recent Labs  Lab 03/02/2018 2241  AST 30  ALT 17  ALKPHOS 56  BILITOT 0.4  ALBUMIN 3.4*   Cardiac Enzymes Recent Labs  Lab 03/11/18 0708 03/11/18 1314 03/12/18 0002  TROPONINI 0.41* 0.92* 0.94*   Glucose Recent Labs  Lab 03/14/18 0827 03/14/18 1124 03/14/18 1611 03/14/18 1927 03/14/18 2341 03/15/18 0345  GLUCAP 138* 119* 109* 134* 145* 142*   Imaging Dg Abd 1 View  Result Date: 03/14/2018 CLINICAL DATA:  Orogastric tube placement EXAM: ABDOMEN - 1 VIEW COMPARISON:  Three days ago FINDINGS: An orogastric tube tip is at the distal stomach. Bowel gas pattern is nonobstructive. Similar appearance of stool and gas in the transverse colon. Stool is also seen the level of the rectum. IMPRESSION: Orogastric tube tip at the distal stomach. Electronically Signed   By: Monte Fantasia M.D.   On: 03/14/2018 09:34   Dg Chest Port 1 View  Result Date: 03/15/2018 CLINICAL DATA:  Ventilator support. EXAM: PORTABLE CHEST 1 VIEW COMPARISON:  03/14/2018 FINDINGS: Endotracheal tube tip is 5 cm above the carina. Left subclavian central line tip is 2 cm above the right atrium. Nasogastric tube enters the stomach. There is improved aeration in both lower lobes. No  worsening or new finding. IMPRESSION: Lines and tubes well position. Improved aeration in both lower lobes. Electronically Signed   By: Nelson Chimes M.D.   On: 03/15/2018 07:27   STUDIES:  CXR 3/25 >> Pneumonia at RUL  CULTURES: Blood 3/25 >> Urine 3/25 >> multiple species, recollect suggested Sputum 3/25 >>  ANTIBIOTICS: Rocephine 3/25 >> Zithromax 3/25 >>  SIGNIFICANT EVENTS:  3/25 VDRF on BiPAP, failed / intubated   LINES/TUBES: ETT 3/25 >> L [place] TLC 3/25 >>  DISCUSSION: 84 year old female admitted 3/25 with RUL pneumonia. Per husband, she has had a very poor quality of life over the last 9 months since her MI.  Failed BiPAP and required intubation 3/25.  Made LCB after intubation.     ASSESSMENT / PLAN:  PULMONARY A: VDRF due to PNA - PE ruled out  COPD  P:   PRVC 8cc/kg Wean PEEP / FiO2 for sats > 90% Follow intermittent CXR  Continue Duoneb + Pulmicort  Daily SBT / WUA   CARDIOVASCULAR A:  Borderline BP - resolved RBBB - known  P:  ICU monitoring  Continue lisinopril Increase coreg to 6.25 mg BID  RENAL A:   No active issues P:   Consider repeat lasix 3/30  Trend BMP / urinary output Replace electrolytes as indicated Avoid nephrotoxic agents, ensure adequate renal perfusion  GASTROINTESTINAL A:   No active issues P:   PPI for SUP  TF per Nutrition   HEMATOLOGIC A:   Anemia P:  Trend CBC  INFECTIOUS A:   RUL PNA P:   Continue abx as above Follow cultures  ENDOCRINE A:   DM Hx Acquired Hypothyroidism    P:   SSI  NEUROLOGIC A:   Acute Encephalopathy - in setting of sepsis  P:   RASS goal: 0 to -1  Discontinue fentanyl gtt > transition to PRN  Precedex gtt   FAMILY  - Updates: No family at bedside 3/29 am.  Prior discussion > Daughter updated at length bedside, no CPR/cardioversion, not trach/peg once extubated the no reintubation.  CC Time: 46 minutes   Noe Gens, NP-C [facility] Pgr:  [phone] or if no answer [phone] 03/15/2018, 9:13 AM  Attending Note:  84 year old female with RUL PNA and fluid overload.  Patient was intubated and once ready for extubation will be a one way extubation.  On exam, weaning but with high RR and low Tv with diffuse crackles.  I reviewed CXR myself, ETT is ok and low volumes noted.  Discussed with PCCM-NP.  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  Will continue active diureses.  Increase precedex to control anxiety.  Once ready for extubation then will  extubate with no intention to reintubate.  The patient is critically ill with multiple organ systems failure and requires high complexity decision making for assessment and support, frequent evaluation and titration of therapies, application of advanced monitoring technologies and extensive interpretation of multiple databases.   Critical Care Time devoted to patient care services described in this note is  35  Minutes. This time reflects time of care of this signee Dr Jennet Maduro. This critical care time does not reflect procedure time, or teaching time or supervisory time of PA/NP/Med student/Med Resident etc but could involve care discussion time.  Rush Farmer, M.D. [facility] Pulmonary/Critical Care Medicine. Pager: [phone]. After hours pager: [phone].

## 2018-03-15 NOTE — Progress Notes (Signed)
Wasted 90 ml of fentanyl via sink with Polly Cobia, charge nurse.

## 2018-03-15 NOTE — Progress Notes (Signed)
Pt daughter approached RN w/ concern regarding frequency of oral care; She reported concern that lack of frequent oral care was the cause of patient's agitation. This RN explained that oral care is done every 2 hours and as needed. Encouraged family to participate in swabbing the patients mouth. RN offered education on how to use suction swabs; family refused. The daughter insisted RN give the patient water. Education provided on risk for aspiration and why water could not be given with ETT in place. Thorough oral care was completed with daughter present. Will continue Q2H oral care and provide education to family if concern should arise again.

## 2018-03-16 ENCOUNTER — Inpatient Hospital Stay (HOSPITAL_COMMUNITY): Payer: Medicare HMO

## 2018-03-16 LAB — GLUCOSE, CAPILLARY
GLUCOSE-CAPILLARY: 167 mg/dL — AB (ref 65–99)
GLUCOSE-CAPILLARY: 146 mg/dL — AB (ref 65–99)
Glucose-Capillary: 162 mg/dL — ABNORMAL HIGH (ref 65–99)
Glucose-Capillary: 165 mg/dL — ABNORMAL HIGH (ref 65–99)
Glucose-Capillary: 226 mg/dL — ABNORMAL HIGH (ref 65–99)
Glucose-Capillary: 162 mg/dL — ABNORMAL HIGH (ref 65–99)
Glucose-Capillary: 168 mg/dL — ABNORMAL HIGH (ref 65–99)
Glucose-Capillary: 133 mg/dL — ABNORMAL HIGH (ref 65–99)

## 2018-03-16 LAB — BLOOD GAS, ARTERIAL
ACID-BASE EXCESS: 13.2 mmol/L — AB (ref 0.0–2.0)
Bicarbonate: 37.3 mmol/L — ABNORMAL HIGH (ref 20.0–28.0)
DRAWN BY: 308601
FIO2: 30
O2 Saturation: 93.6 %
PEEP: 5 cmH2O
Patient temperature: 98.6
RATE: 18 resp/min
VT: 420 mL
pCO2 arterial: 44.3 mmHg (ref 32.0–48.0)
pH, Arterial: 7.535 — ABNORMAL HIGH (ref 7.350–7.450)
pO2, Arterial: 64.6 mmHg — ABNORMAL LOW (ref 83.0–108.0)

## 2018-03-16 LAB — BASIC METABOLIC PANEL
Anion gap: 9 (ref 5–15)
BUN: 37 mg/dL — AB (ref 6–20)
CHLORIDE: 101 mmol/L (ref 101–111)
CO2: 34 mmol/L — ABNORMAL HIGH (ref 22–32)
CREATININE: 0.74 mg/dL (ref 0.44–1.00)
Calcium: 7.9 mg/dL — ABNORMAL LOW (ref 8.9–10.3)
Glucose, Bld: 165 mg/dL — ABNORMAL HIGH (ref 65–99)
POTASSIUM: 3.3 mmol/L — AB (ref 3.5–5.1)
SODIUM: 144 mmol/L (ref 135–145)

## 2018-03-16 LAB — CBC
HCT: 25.7 % — ABNORMAL LOW (ref 36.0–46.0)
HEMOGLOBIN: 7.9 g/dL — AB (ref 12.0–15.0)
MCH: 27.9 pg (ref 26.0–34.0)
MCHC: 30.7 g/dL (ref 30.0–36.0)
MCV: 90.8 fL (ref 78.0–100.0)
PLATELETS: 211 10*3/uL (ref 150–400)
RBC: 2.83 MIL/uL — AB (ref 3.87–5.11)
RDW: 17.1 % — ABNORMAL HIGH (ref 11.5–15.5)
WBC: 11.8 10*3/uL — ABNORMAL HIGH (ref 4.0–10.5)

## 2018-03-16 LAB — CULTURE, BLOOD (ROUTINE X 2)
CULTURE: NO GROWTH
CULTURE: NO GROWTH
SPECIAL REQUESTS: ADEQUATE
Special Requests: ADEQUATE

## 2018-03-16 LAB — MAGNESIUM: Magnesium: 1.5 mg/dL — ABNORMAL LOW (ref 1.7–2.4)

## 2018-03-16 LAB — PHOSPHORUS: PHOSPHORUS: 1.7 mg/dL — AB (ref 2.5–4.6)

## 2018-03-16 MED ORDER — POTASSIUM CHLORIDE CRYS ER 20 MEQ PO TBCR
40.0000 meq | EXTENDED_RELEASE_TABLET | Freq: Three times a day (TID) | ORAL | Status: AC
  Administered 2018-03-16 (×2): 40 meq via ORAL
  Filled 2018-03-16 (×2): qty 2

## 2018-03-16 MED ORDER — POTASSIUM CHLORIDE CRYS ER 20 MEQ PO TBCR
40.0000 meq | EXTENDED_RELEASE_TABLET | Freq: Once | ORAL | Status: AC
  Administered 2018-03-16: 40 meq via ORAL
  Filled 2018-03-16: qty 2

## 2018-03-16 MED ORDER — MAGNESIUM SULFATE 2 GM/50ML IV SOLN
2.0000 g | Freq: Once | INTRAVENOUS | Status: AC
  Administered 2018-03-16: 2 g via INTRAVENOUS
  Filled 2018-03-16: qty 50

## 2018-03-16 MED ORDER — DEXMEDETOMIDINE HCL IN NACL 400 MCG/100ML IV SOLN
0.0000 ug/kg/h | INTRAVENOUS | Status: DC
Start: 2018-03-16 — End: 2018-03-17
  Administered 2018-03-16: 1.5 ug/kg/h via INTRAVENOUS
  Administered 2018-03-17 (×2): 1.8 ug/kg/h via INTRAVENOUS
  Filled 2018-03-16 (×4): qty 100

## 2018-03-16 MED ORDER — FUROSEMIDE 10 MG/ML IJ SOLN
40.0000 mg | Freq: Three times a day (TID) | INTRAMUSCULAR | Status: AC
  Administered 2018-03-16 (×2): 40 mg via INTRAVENOUS
  Filled 2018-03-16 (×2): qty 4

## 2018-03-16 MED ORDER — POTASSIUM PHOSPHATES 15 MMOLE/5ML IV SOLN
30.0000 mmol | Freq: Once | INTRAVENOUS | Status: AC
  Administered 2018-03-16: 30 mmol via INTRAVENOUS
  Filled 2018-03-16: qty 10

## 2018-03-16 NOTE — Progress Notes (Signed)
PULMONARY / CRITICAL CARE MEDICINE   Name: Melissa Briggs MRN: [id number] DOB: [birth year]    ADMISSION DATE:  02/20/2018 CONSULTATION DATE:  03/11/2018  REFERRING MD:  Denton Brick TRH-MD  CHIEF COMPLAINT:  CAP and respiratory failure  BRIEF SUMMARY:   84 year old female with extensive PMH who presents to the hospital with SOB and AMS.  Patient was noted to have a PNA at the RUL and hypercarbic respiratory failure.  Patient initially required BiPAP, confused and incoherent.  2D echo with RV with good function, preliminary lower ext U/S is negative for DVT making this most likely to be pneumonia.  PCCM consulted for respiratory failure.  Developed progressive respiratory failure > intubated 3/25.    SUBJECTIVE:   Continues to be very agitated when off precedex  VITAL SIGNS: BP 140/78 (BP Location: Left Arm)   Pulse 67   Temp 98.9 F (37.2 C) (Oral)   Resp (!) 26   Ht 5\' 3"  (1.6 m)   Wt 208 lb 12.4 oz (94.7 kg)   SpO2 98%   BMI 36.98 kg/m   HEMODYNAMICS:    VENTILATOR SETTINGS: Vent Mode: PRVC FiO2 (%):  [30 %] 30 % Set Rate:  [18 bmp] 18 bmp Vt Set:  [420 mL] 420 mL PEEP:  [5 cmH20] 5 cmH20 Plateau Pressure:  [17 cmH20-23 cmH20] 19 cmH20  INTAKE / OUTPUT: I/O last 3 completed shifts: In: 2030.6 [I.V.:210.6; NG/GT:1720; IV Piggyback:100] Out: 4400 [Urine:4400]  PHYSICAL EXAMINATION: General:  Elderly female in NAD on vent HEENT: MM pink/moist, ETT, thick oral secretions Neuro: Awakens easily but very agitated, asking for help with breathing CV: RRR, Nl S1/S2 and -M/R/G. PULM: CTA bilaterally GI: Soft, NT, ND and +BS Extremities: 1+ edema Skin: no rashes or lesions, thin / fragile skin  LABS:  BMET Recent Labs  Lab 03/14/18 0502 03/15/18 0527 03/16/18 0500  NA 141 145 144  K 3.6 4.3 3.3*  CL 101 101 101  CO2 32 34* 34*  BUN 37* 39* 37*  CREATININE 0.72 0.87 0.74  GLUCOSE 164* 138* 165*   Electrolytes Recent Labs  Lab 03/13/18 0418 03/14/18 0502  03/15/18 0527 03/16/18 0500  CALCIUM 7.8* 7.7* 8.0* 7.9*  MG 1.9  --  1.8 1.5*  PHOS 3.0  --  2.6 1.7*   CBC Recent Labs  Lab 03/14/18 0502 03/15/18 0527 03/16/18 0500  WBC 9.7 15.1* 11.8*  HGB 8.5* 9.0* 7.9*  HCT 27.7* 30.2* 25.7*  PLT 233 262 211   Coag's No results for input(s): APTT, INR in the last 168 hours.  Sepsis Markers Recent Labs  Lab 03/11/18 0837  PROCALCITON 0.10   ABG Recent Labs  Lab 03/13/18 0317 03/15/18 0415 03/16/18 0400  PHART 7.360 7.387 7.535*  PCO2ART 54.1* 59.0* 44.3  PO2ART 67.2* 67.0* 64.6*   Liver Enzymes Recent Labs  Lab 02/15/2018 2241  AST 30  ALT 17  ALKPHOS 56  BILITOT 0.4  ALBUMIN 3.4*   Cardiac Enzymes Recent Labs  Lab 03/11/18 0708 03/11/18 1314 03/12/18 0002  TROPONINI 0.41* 0.92* 0.94*   Glucose Recent Labs  Lab 03/15/18 1336 03/15/18 1522 03/15/18 1946 03/16/18 0015 03/16/18 0403 03/16/18 0818  GLUCAP 161* 150* 167* 162* 165* 226*   Imaging Dg Chest Port 1 View  Result Date: 03/16/2018 CLINICAL DATA:  Evaluate ET tube position. EXAM: PORTABLE CHEST 1 VIEW COMPARISON:  03/15/2018 FINDINGS: There is moderate cardiac enlargement. Aortic atherosclerosis. Left subclavian central venous catheter tip projects over the cavoatrial junction. Enteric tube tip  is below the GE junction. Aortic atherosclerosis noted. Very low lung volumes. No change in aeration a lungs compared with prior exam. IMPRESSION: 1. Stable position of support apparatus. 2. Persistent low lung volumes. 3.  Aortic Atherosclerosis (ICD10-I70.0). Electronically Signed   By: Kerby Moors M.D.   On: 03/16/2018 07:55   STUDIES:  CXR 3/25 >> Pneumonia at RUL  CULTURES: Blood 3/25 >> Urine 3/25 >> multiple species, recollect suggested Sputum 3/25 >>  ANTIBIOTICS: Rocephine 3/25 >> Zithromax 3/25 >>  SIGNIFICANT EVENTS:  3/25 VDRF on BiPAP, failed / intubated   LINES/TUBES: ETT 3/25 >> L [place] TLC 3/25 >>  DISCUSSION: 84 year old female  admitted 3/25 with RUL pneumonia. Per husband, she has had a very poor quality of life over the last 9 months since her MI.  Failed BiPAP and required intubation 3/25.  Made LCB after intubation.    ASSESSMENT / PLAN:  PULMONARY A: VDRF due to PNA - PE ruled out  COPD  P:   PRVC 8cc/kg Wean PEEP / FiO2 for sats > 90% Follow intermittent CXR  Continue Duoneb + Pulmicort  Daily SBT / WUA   CARDIOVASCULAR A:  Borderline BP - resolved RBBB - known  P:  ICU monitoring  Continue lisinopril Continue coreg to 6.25 mg BID  RENAL A:   No active issues P:   Lasix 40 mg IV q8 x2 doses Trend BMP/urinary output Replace electrolytes as indicated Avoid nephrotoxic agents, ensure adequate renal perfusion  GASTROINTESTINAL A:   No active issues P:   PPI for SUP  TF per Nutrition   HEMATOLOGIC A:   Anemia P:  Trend CBC  INFECTIOUS A:   RUL PNA P:   Continue abx as above Follow cultures  ENDOCRINE A:   DM Hx Acquired Hypothyroidism    P:   SSI  NEUROLOGIC A:   Acute Encephalopathy - in setting of sepsis  P:   RASS goal: 0 to -1  Discontinue fentanyl gtt > transition to PRN  Precedex gtt   FAMILY  - Updates: No family available bedside  The patient is critically ill with multiple organ systems failure and requires high complexity decision making for assessment and support, frequent evaluation and titration of therapies, application of advanced monitoring technologies and extensive interpretation of multiple databases.   Critical Care Time devoted to patient care services described in this note is  35  Minutes. This time reflects time of care of this signee Dr Jennet Maduro. This critical care time does not reflect procedure time, or teaching time or supervisory time of PA/NP/Med student/Med Resident etc but could involve care discussion time.  Rush Farmer, M.D. [facility] Pulmonary/Critical Care Medicine. Pager: [phone]. After hours pager: [phone].

## 2018-03-16 NOTE — Progress Notes (Signed)
[facility] [place] Progress Note Patient Name: Melissa Briggs DOB: 1928-09-06 MRN: [id number]   Date of Service  03/16/2018  HPI/Events of Note  Request to renew soft bilateral wrist restraints.   eICU Interventions  Will renew order for soft bilateral wrist restraints.     Intervention Category Minor Interventions: Agitation / anxiety - evaluation and management  Sommer,Steven Eugene 03/16/2018, 8:08 PM

## 2018-03-17 ENCOUNTER — Inpatient Hospital Stay (HOSPITAL_COMMUNITY): Payer: Medicare HMO

## 2018-03-17 LAB — BLOOD GAS, ARTERIAL
Acid-Base Excess: 11.1 mmol/L — ABNORMAL HIGH (ref 0.0–2.0)
Bicarbonate: 36.7 mmol/L — ABNORMAL HIGH (ref 20.0–28.0)
Drawn by: 308601
FIO2: 30
O2 SAT: 96.7 %
PEEP: 5 cmH2O
Patient temperature: 37
RATE: 18 resp/min
VT: 420 mL
pCO2 arterial: 57.5 mmHg — ABNORMAL HIGH (ref 32.0–48.0)
pH, Arterial: 7.421 (ref 7.350–7.450)
pO2, Arterial: 90 mmHg (ref 83.0–108.0)

## 2018-03-17 LAB — CBC WITH DIFFERENTIAL/PLATELET
BASOS ABS: 0 10*3/uL (ref 0.0–0.1)
BASOS PCT: 0 %
Eosinophils Absolute: 0.6 10*3/uL (ref 0.0–0.7)
Eosinophils Relative: 5 %
HEMATOCRIT: 25.5 % — AB (ref 36.0–46.0)
HEMOGLOBIN: 8.2 g/dL — AB (ref 12.0–15.0)
Lymphocytes Relative: 8 %
Lymphs Abs: 0.9 10*3/uL (ref 0.7–4.0)
MCH: 28.8 pg (ref 26.0–34.0)
MCHC: 32.2 g/dL (ref 30.0–36.0)
MCV: 89.5 fL (ref 78.0–100.0)
Monocytes Absolute: 1 10*3/uL (ref 0.1–1.0)
Monocytes Relative: 9 %
NEUTROS ABS: 8.4 10*3/uL — AB (ref 1.7–7.7)
NEUTROS PCT: 78 %
Platelets: 233 10*3/uL (ref 150–400)
RBC: 2.85 MIL/uL — AB (ref 3.87–5.11)
RDW: 17 % — ABNORMAL HIGH (ref 11.5–15.5)
WBC: 10.8 10*3/uL — AB (ref 4.0–10.5)

## 2018-03-17 LAB — BASIC METABOLIC PANEL
ANION GAP: 5 (ref 5–15)
ANION GAP: 7 (ref 5–15)
BUN: 27 mg/dL — ABNORMAL HIGH (ref 6–20)
BUN: 36 mg/dL — ABNORMAL HIGH (ref 6–20)
CHLORIDE: 115 mmol/L — AB (ref 101–111)
CHLORIDE: 97 mmol/L — AB (ref 101–111)
CO2: 25 mmol/L (ref 22–32)
CO2: 36 mmol/L — AB (ref 22–32)
Calcium: <4 mg/dL — CL (ref 8.9–10.3)
Calcium: 6.2 mg/dL — CL (ref 8.9–10.3)
Creatinine, Ser: 0.42 mg/dL — ABNORMAL LOW (ref 0.44–1.00)
Creatinine, Ser: 0.86 mg/dL (ref 0.44–1.00)
GFR calc non Af Amer: >60 mL/min (ref >60)
GFR calc non Af Amer: 59 mL/min — ABNORMAL LOW (ref >60)
Glucose, Bld: 117 mg/dL — ABNORMAL HIGH (ref 65–99)
Glucose, Bld: 145 mg/dL — ABNORMAL HIGH (ref 65–99)
Potassium: 3.7 mmol/L (ref 3.5–5.1)
Potassium: 6.2 mmol/L — ABNORMAL HIGH (ref 3.5–5.1)
SODIUM: 140 mmol/L (ref 135–145)
Sodium: 145 mmol/L (ref 135–145)

## 2018-03-17 LAB — CBC
HEMATOCRIT: 19.1 % — AB (ref 36.0–46.0)
HEMOGLOBIN: 5.9 g/dL — AB (ref 12.0–15.0)
MCH: 28 pg (ref 26.0–34.0)
MCHC: 30.9 g/dL (ref 30.0–36.0)
MCV: 90.5 fL (ref 78.0–100.0)
Platelets: 163 10*3/uL (ref 150–400)
RBC: 2.11 MIL/uL — ABNORMAL LOW (ref 3.87–5.11)
RDW: 17.2 % — ABNORMAL HIGH (ref 11.5–15.5)
WBC: 9.2 10*3/uL (ref 4.0–10.5)

## 2018-03-17 LAB — GLUCOSE, CAPILLARY
Glucose-Capillary: 120 mg/dL — ABNORMAL HIGH (ref 65–99)
Glucose-Capillary: 159 mg/dL — ABNORMAL HIGH (ref 65–99)

## 2018-03-17 LAB — MAGNESIUM: Magnesium: 1 mg/dL — ABNORMAL LOW (ref 1.7–2.4)

## 2018-03-17 LAB — PHOSPHORUS: PHOSPHORUS: 2.5 mg/dL (ref 2.5–4.6)

## 2018-03-17 MED ORDER — MORPHINE BOLUS VIA INFUSION
5.0000 mg | INTRAVENOUS | Status: DC | PRN
  Filled 2018-03-17: qty 20

## 2018-03-17 MED ORDER — METHYLPREDNISOLONE SODIUM SUCC 40 MG IJ SOLR
INTRAMUSCULAR | Status: AC
  Filled 2018-03-17: qty 1

## 2018-03-17 MED ORDER — METHYLPREDNISOLONE SODIUM SUCC 40 MG IJ SOLR
40.0000 mg | Freq: Four times a day (QID) | INTRAMUSCULAR | Status: DC
  Administered 2018-03-17: 40 mg via INTRAVENOUS

## 2018-03-17 MED ORDER — FENTANYL CITRATE (PF) 100 MCG/2ML IJ SOLN
50.0000 ug | INTRAMUSCULAR | Status: DC | PRN
  Administered 2018-03-16 – 2018-03-17 (×5): 50 ug via INTRAVENOUS
  Filled 2018-03-17 (×4): qty 2

## 2018-03-17 MED ORDER — MORPHINE 100MG IN NS 100ML (1MG/ML) PREMIX INFUSION
10.0000 mg/h | INTRAVENOUS | Status: DC
  Administered 2018-03-17 (×4): 10 mg/h via INTRAVENOUS
  Filled 2018-03-17: qty 100

## 2018-03-17 MED ORDER — MORPHINE SULFATE (PF) 4 MG/ML IV SOLN
INTRAVENOUS | Status: AC
  Administered 2018-03-17: 2 mg
  Filled 2018-03-17: qty 1

## 2018-03-18 NOTE — Progress Notes (Signed)
Pt on bipap at 40%, O2 sats dropping to low 80's. Respiratory notified and instructions given to increase O2 to 60%. Pt's sats increased to 96%. Heart rate in the 80's. Pt's sats began to drop again and daughter called per Dr Nelda Marseille to make decision to reintubate pt or start comfort care. Family wants comfort care. Pt given 2mg  morphine per Dr Nelda Marseille for increased work of breathing and discomfort. Will start morphine gtt and remove bi pap when family arrives.

## 2018-03-18 NOTE — Progress Notes (Signed)
Wasted 4ml morphine with Sapna RN

## 2018-03-18 NOTE — Progress Notes (Signed)
[facility] Progress Note Patient Name: Melissa Briggs DOB: 1928/03/06 MRN: [id number]   Date of Service  19-Mar-2018  HPI/Events of Note  Agitation - Already at ceiling dose of Precedex at 1.5 mcg/kg/hour and Fentanyl IV PRN.   eICU Interventions  Will order: 1. Increase ceiling on Precedex IV infusion to 1.8 mcg/kg/hour. 2. Increase Fentanyl IV dose to Q 1 hour PRN.      Intervention Category Major Interventions: Other:  Lysle Dingwall , 12:16 AM

## 2018-03-18 NOTE — Progress Notes (Signed)
[facility] Progress Note Patient Name: Melissa Briggs DOB: February 03, 1928 MRN: [id number]   Date of Service  2018-03-31  HPI/Events of Note  Patient self extubated. Sat = 96% and RR = 27 on 4 L/min [place] O2. She sounds like she has stridor over the video link. She is a partial code and I appreciate talk about a plan for one way extubation within the next several days.   eICU Interventions  Will order: 1. Solumedrol 40 mg IV now and Q 6 hours.  2. Will monitor closely..     Intervention Category Major Interventions: Other:  Lysle Dingwall 03-31-18, 6:35 AM

## 2018-03-18 NOTE — Progress Notes (Signed)
RT called to bedside due to Pt pulling ET tube out.  Pt placed on 4 LPM [place] and tolerating fairly well, mild stridor noted.  RT to monitor and assess as needed.

## 2018-03-18 NOTE — Progress Notes (Signed)
Pt passed at 1140 with family at bedside. Dr Nelda Marseille notified, [place] [facility] notified. Obtained funeral home information from family and added to flow sheet.

## 2018-03-18 NOTE — Progress Notes (Signed)
Bipap removed at 1100, family at bedside.

## 2018-03-18 NOTE — Progress Notes (Signed)
PULMONARY / CRITICAL CARE MEDICINE   Name: Melissa Briggs MRN: [id number] DOB: September 06, 1928    ADMISSION DATE:  02/21/2018 CONSULTATION DATE:  03/11/2018  REFERRING MD:  Denton Brick TRH-MD  CHIEF COMPLAINT:  CAP and respiratory failure  BRIEF SUMMARY:   84 year old female with extensive PMH who presents to the hospital with SOB and AMS.  Patient was noted to have a PNA at the RUL and hypercarbic respiratory failure.  Patient initially required BiPAP, confused and incoherent.  2D echo with RV with good function, preliminary lower ext U/S is negative for DVT making this most likely to be pneumonia.  PCCM consulted for respiratory failure.  Developed progressive respiratory failure > intubated 3/25.    SUBJECTIVE:   Self extubated early this AM, bradycardia post self extubation and now on BiPAP  VITAL SIGNS: BP (!) 141/53 (BP Location: Right Arm)   Pulse 85   Temp 98.5 F (36.9 C) (Axillary)   Resp (!) 26   Ht 5\' 3"  (1.6 m)   Wt 207 lb 3.7 oz (94 kg)   SpO2 97%   BMI 36.71 kg/m   HEMODYNAMICS:    VENTILATOR SETTINGS: Vent Mode: BIPAP FiO2 (%):  [30 %-100 %] 70 % Set Rate:  [10 bmp-18 bmp] 10 bmp Vt Set:  [420 mL-440 mL] 420 mL PEEP:  [5 cmH20] 5 cmH20 Pressure Support:  [8 cmH20] 8 cmH20 Plateau Pressure:  [17 cmH20-25 cmH20] 20 cmH20  INTAKE / OUTPUT: I/O last 3 completed shifts: In: 3284.2 [I.V.:1324.2; NG/GT:1350; IV [id number]:086] Out: 7619 [Urine:5350]  PHYSICAL EXAMINATION: General:  Elderly female in respiratory distress post self extubation on BiPAP HEENT: Micro/AT, PERRL, EOM-I and DMM Neuro: Barely responsive at this point, withdrawing to pain CV: RRR, Nl S1/S2 and -M/R/G. PULM: Significantly diminished BS diffusely GI: Soft, NT, ND and +BS Extremities: 1+ edema Skin: no rashes or lesions, thin / fragile skin  LABS:  BMET Recent Labs  Lab 03/16/18 0500 09-Apr-2018 0413 09-Apr-2018 0622  NA 144 145 140  K 3.3* 3.7 6.2*  CL 101 115* 97*  CO2 34* 25 36*  BUN  37* 27* 36*  CREATININE 0.74 0.42* 0.86  GLUCOSE 165* 117* 145*   Electrolytes Recent Labs  Lab 03/15/18 0527 03/16/18 0500 2018-04-09 0413 2018/04/09 0622  CALCIUM 8.0* 7.9* <4.0* 6.2*  MG 1.8 1.5* 1.0*  --   PHOS 2.6 1.7* 2.5  --    CBC Recent Labs  Lab 03/16/18 0500 09-Apr-2018 0413 04/09/2018 0622  WBC 11.8* 9.2 10.8*  HGB 7.9* 5.9* 8.2*  HCT 25.7* 19.1* 25.5*  PLT 211 163 233   Coag's No results for input(s): APTT, INR in the last 168 hours.  Sepsis Markers Recent Labs  Lab 03/11/18 0837  PROCALCITON 0.10   ABG Recent Labs  Lab 03/15/18 0415 03/16/18 0400 2018/04/09 0315  PHART 7.387 7.535* 7.421  PCO2ART 59.0* 44.3 57.5*  PO2ART 67.0* 64.6* 90.0   Liver Enzymes Recent Labs  Lab 02/28/2018 2241  AST 30  ALT 17  ALKPHOS 56  BILITOT 0.4  ALBUMIN 3.4*   Cardiac Enzymes Recent Labs  Lab 03/11/18 0708 03/11/18 1314 03/12/18 0002  TROPONINI 0.41* 0.92* 0.94*   Glucose Recent Labs  Lab 03/16/18 1243 03/16/18 1648 03/16/18 1917 03/16/18 2319 April 09, 2018 0310 Apr 09, 2018 0815  GLUCAP 162* 168* 146* 133* 120* 159*   Imaging Dg Chest Port 1 View  Result Date: 2018-04-09 CLINICAL DATA:  History ET tube. EXAM: PORTABLE CHEST 1 VIEW COMPARISON:  03/16/2018 FINDINGS: ETT tip is  above the carina. There is a left subclavian catheter with tip in the projection of the SVC. Stable mild cardiac enlargement. Aortic atherosclerosis. Persistent low lung volumes. There is a new opacity overlying the right lung apex. IMPRESSION: 1. Stable support apparatus. 2. New right upper lobe opacity. Electronically Signed   By: Kerby Moors M.D.   On: Mar 22, 2018 07:35   STUDIES:  CXR 3/25 >> Pneumonia at RUL  CULTURES: Blood 3/25 >> Urine 3/25 >> multiple species, recollect suggested Sputum 3/25 >>  ANTIBIOTICS: Rocephine 3/25 >> Zithromax 3/25 >>  SIGNIFICANT EVENTS:  3/25 VDRF on BiPAP, failed / intubated   LINES/TUBES: ETT 3/25 >>3/31 L [place] TLC 3/25  >>  DISCUSSION: 84 year old female admitted 3/25 with RUL pneumonia. Per husband, she has had a very poor quality of life over the last 9 months since her MI.  Failed BiPAP and required intubation 3/25.  Full DNR now after she self extubated, see discussion below.  ASSESSMENT / PLAN:  PULMONARY A: VDRF due to PNA - PE ruled out  COPD  P:   BiPAP for now til family arrives then full comfort care  CARDIOVASCULAR A:  Borderline BP - resolved RBBB - known  P:  D/C tele Full comfort care  RENAL A:   No active issues P:   D/C further blood draws  GASTROINTESTINAL A:   No active issues P:   NPO given mental status  HEMATOLOGIC A:   Anemia P:  D/C further CBC checks  INFECTIOUS A:   RUL PNA P:   D/C abx  ENDOCRINE A:   DM Hx Acquired Hypothyroidism    P:   D/C CBGs  NEUROLOGIC A:   Acute Encephalopathy - in setting of sepsis  P:   Morphine for comfort measures  FAMILY  - Updates: Updated daughter over the phone, patient is not doing well post self extubation, currently on BiPAP and had a period of bradycardia post self extubation.  Will make a full DNR, start morphine now and once family arrives will start morphine drip.  The patient is critically ill with multiple organ systems failure and requires high complexity decision making for assessment and support, frequent evaluation and titration of therapies, application of advanced monitoring technologies and extensive interpretation of multiple databases.   Critical Care Time devoted to patient care services described in this note is  35  Minutes. This time reflects time of care of this signee Dr Jennet Maduro. This critical care time does not reflect procedure time, or teaching time or supervisory time of PA/NP/Med student/Med Resident etc but could involve care discussion time.  Rush Farmer, M.D. [facility] Pulmonary/Critical Care Medicine. Pager: [phone]. After hours pager: [phone].

## 2018-03-18 NOTE — Progress Notes (Signed)
This nurse responded to the patients ventilator alarming at 0620.  Upon entering the room the patient had self extubated and removed her OG tube.  Patient was placed on 4L via nasal cannula and was maintaining O2 sat's in the mid 90's.  Following verbal orders from Dr. Oletta Darter, patients sedation was shut off.  At [phone] patient began to desat into the 70's and became lethargic. Patient was then placed on 15L via non rebreather mask, O2 sat's returned to the mid 90's and maintained.  Orders placed by Dr. Oletta Darter for [place] and RT contacted.

## 2018-03-18 NOTE — Progress Notes (Signed)
Morphine gtt started at 1030, family at bedside. Bipap will be discontinued when pt is more comfortable.

## 2018-03-18 DEATH — deceased

## 2018-03-20 ENCOUNTER — Telehealth: Payer: Self-pay

## 2018-03-20 NOTE — Telephone Encounter (Signed)
On 03/20/18 I received a d/c from [facility] (original). The d/c is for cremation. The patient is a patient of Doctor Nelda Marseille.  The d/c will be taken to [facility] ICU for signature.  On 03/21/18 I received the d/c back from Doctor Halford Chessman who signed the d/c for Doctor Nelda Marseille. I got the d/c ready and called the funeral home to let them know the d/c is ready for pickup. I also faxed a copy to the funeral home per the funeral home request.

## 2018-04-17 NOTE — Death Summary Note (Signed)
DEATH SUMMARY   Patient Details  Name: Melissa Briggs MRN: [id number] DOB: January 17, 1928  Admission/Discharge Information   Admit Date:  March 14, 2018  Date of Death: Date of Death: 03-21-2018  Time of Death: Time of Death: 1140  Length of Stay: 02/25/23  Referring Physician: No primary care provider on file.   Reason(s) for Hospitalization  Pneumonia and respiratory failure  Diagnoses  Preliminary cause of death: Acute hypoxemic respiratory failure Secondary Diagnoses (including complications and co-morbidities):  Principal Problem:   Symptomatic anemia Active Problems:   CAD S/P percutaneous coronary angioplasty   Hypoxia   Acute respiratory failure with hypoxemia ([place])   Community acquired pneumonia of right upper lobe of lung [facility])   Brief Hospital Course (including significant findings, care, treatment, and services provided and events leading to death)  84 year old female with extensive PMH who presents to the hospital with SOB and AMS.  Patient was noted to have a PNA at the RUL and hypercarbic respiratory failure.  Patient initially required BiPAP, confused and incoherent.  2D echo with RV with good function, preliminary lower ext U/S is negative for DVT making this most likely to be pneumonia.  PCCM consulted for respiratory failure.  Developed progressive respiratory failure > intubated 3/25.    Patient self extubated and decompensated quickly, unable to maintain saturation.  Spoke with family, patient would not want to be reintubated.  Morphine started and BiPAP d/ced for patient to expire shortly thereafter comfortable with the family bedside.   Pertinent Labs and Studies  Significant Diagnostic Studies Dg Chest 2 View  Result Date: 03/11/2018 CLINICAL DATA:  84 year old female with shortness of breath for 2 days. Lower extremity swelling. EXAM: CHEST - 2 VIEW COMPARISON:  01/04/2018 and earlier. FINDINGS: Chronic exaggerated thoracic kyphosis and increased AP dimension to the  chest. Stable cardiomegaly and mediastinal contours. Calcified aortic atherosclerosis. No pneumothorax, pulmonary edema, pleural effusion or acute pulmonary opacity identified. Osteopenia. No acute osseous abnormality identified. Negative visible bowel gas pattern. Chronic postoperative changes to the right chest wall/axilla. IMPRESSION: 1.  No acute cardiopulmonary abnormality. 2. Chronic cardiomegaly, Calcified aortic atherosclerosis. Electronically Signed   By: Genevie Ann M.D.   On: 03/11/2018 00:04   Dg Abd 1 View  Result Date: 03/14/2018 CLINICAL DATA:  Orogastric tube placement EXAM: ABDOMEN - 1 VIEW COMPARISON:  Three days ago FINDINGS: An orogastric tube tip is at the distal stomach. Bowel gas pattern is nonobstructive. Similar appearance of stool and gas in the transverse colon. Stool is also seen the level of the rectum. IMPRESSION: Orogastric tube tip at the distal stomach. Electronically Signed   By: Monte Fantasia M.D.   On: 03/14/2018 09:34   Dg Abd 1 View  Result Date: 03/11/2018 CLINICAL DATA:  Enteric tube placement EXAM: ABDOMEN - 1 VIEW COMPARISON:  None. FINDINGS: Enteric tube terminates in the gastric fundus. No dilated small bowel loops. Moderate to large stool in the large bowel. No evidence of pneumatosis or pneumoperitoneum. Levocurvature of the lumbar spine with marked lumbar spondylosis. No radiopaque nephrolithiasis. Superior approach central venous catheter terminates at the cavoatrial junction. IMPRESSION: Enteric tube terminates in the gastric fundus. Nonobstructive bowel gas pattern. Moderate to large colonic stool volume suggests constipation. Electronically Signed   By: Ilona Sorrel M.D.   On: 03/11/2018 12:11   Ct Angio Chest Pe W Or Wo Contrast  Result Date: 03/11/2018 CLINICAL DATA:  Pneumonia respiratory failure EXAM: CT ANGIOGRAPHY CHEST WITH CONTRAST TECHNIQUE: Multidetector CT imaging of the chest was  performed using the standard protocol during bolus administration  of intravenous contrast. Multiplanar CT image reconstructions and MIPs were obtained to evaluate the vascular anatomy. CONTRAST:  174mL ISOVUE-370 IOPAMIDOL (ISOVUE-370) INJECTION 76% COMPARISON:  Chest x-ray 03/11/2018, CT chest 11/30/2010 FINDINGS: Cardiovascular: Satisfactory opacification of the pulmonary arteries to the segmental level. No evidence of pulmonary embolism. Aneurysmal dilatation of the ascending aorta, measuring up to 4.4 cm in diameter. Moderate aortic atherosclerosis. Coronary vascular calcification. Cardiomegaly. No large pericardial effusion. Enlarged pulmonary arterial trunk measuring up to 3.7 cm. Mediastinum/Nodes: Midline trachea. Endotracheal tube tip terminates just above the carina. Esophageal tube tip is below the diaphragm but incompletely imaged. No significant mediastinal adenopathy. Lungs/Pleura: Small bilateral pleural effusions. Multifocal consolidations and ground-glass densities within the bilateral lower lobes and bilateral upper lobes. No pneumothorax. Upper Abdomen: No acute abnormality. Musculoskeletal: Degenerative changes. No acute or suspicious bone lesion. Review of the MIP images confirms the above findings. IMPRESSION: 1. Negative for acute pulmonary embolus. 2. Small bilateral pleural effusions with multifocal consolidations and ground-glass densities in the upper and lower lobes consistent with multifocal pneumonia 3. Aneurysmal dilatation of the ascending aorta up to 4.4 cm. Recommend annual imaging followup by CTA or MRA. This recommendation follows 2010 ACCF/AHA/AATS/ACR/ASA/SCA/SCAI/SIR/STS/SVM Guidelines for the Diagnosis and Management of Patients with Thoracic Aortic Disease. Circulation. 2010; 121: I948-N462 4. Cardiomegaly. Enlarged pulmonary arterial trunk raises possibility of pulmonary artery hypertension. Aortic Atherosclerosis (ICD10-I70.0).Aortic aneurysm NOS (ICD10-I71.9). Electronically Signed   By: Donavan Foil M.D.   On: 03/11/2018 22:33   Dg  Chest Port 1 View  Result Date: 04-10-2018 CLINICAL DATA:  History ET tube. EXAM: PORTABLE CHEST 1 VIEW COMPARISON:  03/16/2018 FINDINGS: ETT tip is above the carina. There is a left subclavian catheter with tip in the projection of the SVC. Stable mild cardiac enlargement. Aortic atherosclerosis. Persistent low lung volumes. There is a new opacity overlying the right lung apex. IMPRESSION: 1. Stable support apparatus. 2. New right upper lobe opacity. Electronically Signed   By: Kerby Moors M.D.   On: 04-10-2018 07:35   Dg Chest Port 1 View  Result Date: 03/16/2018 CLINICAL DATA:  Evaluate ET tube position. EXAM: PORTABLE CHEST 1 VIEW COMPARISON:  03/15/2018 FINDINGS: There is moderate cardiac enlargement. Aortic atherosclerosis. Left subclavian central venous catheter tip projects over the cavoatrial junction. Enteric tube tip is below the GE junction. Aortic atherosclerosis noted. Very low lung volumes. No change in aeration a lungs compared with prior exam. IMPRESSION: 1. Stable position of support apparatus. 2. Persistent low lung volumes. 3.  Aortic Atherosclerosis (ICD10-I70.0). Electronically Signed   By: Kerby Moors M.D.   On: 03/16/2018 07:55   Dg Chest Port 1 View  Result Date: 03/15/2018 CLINICAL DATA:  Ventilator support. EXAM: PORTABLE CHEST 1 VIEW COMPARISON:  03/14/2018 FINDINGS: Endotracheal tube tip is 5 cm above the carina. Left subclavian central line tip is 2 cm above the right atrium. Nasogastric tube enters the stomach. There is improved aeration in both lower lobes. No worsening or new finding. IMPRESSION: Lines and tubes well position. Improved aeration in both lower lobes. Electronically Signed   By: Nelson Chimes M.D.   On: 03/15/2018 07:27   Dg Chest Port 1 View  Result Date: 03/14/2018 CLINICAL DATA:  Acute respiratory failure with hypoxia EXAM: PORTABLE CHEST 1 VIEW COMPARISON:  03/13/2018 FINDINGS: Endotracheal tube in good position. NG tube tip not well seen but may  be in the distal esophagus. Bibasilar airspace disease and bilateral effusions unchanged. Pulmonary vascular congestion  unchanged. Left subclavian central venous catheter tip in the SVC is unchanged. IMPRESSION: Bibasilar airspace disease and bilateral effusions unchanged. Pulmonary vascular congestion remains. Endotracheal tube and central line in good position. NG tip appears to be in the distal esophagus. Electronically Signed   By: Franchot Gallo M.D.   On: 03/14/2018 07:28   Dg Chest Port 1 View  Result Date: 03/13/2018 CLINICAL DATA:  ET tube position EXAM: PORTABLE CHEST 1 VIEW COMPARISON:  03/12/2018 FINDINGS: ET tube is in stable position approximately 3.4 cm above the carina. NG tube is in the distal esophagus or proximal stomach. Cardiomegaly. Elevation of the right hemidiaphragm, new since prior study. Bilateral perihilar and lower lobe opacities could reflect edema or infection. Suspect layering left effusion. IMPRESSION: Bilateral perihilar and lower lobe opacities, edema versus infection. Layering left effusion. Elevation of the right hemidiaphragm. Electronically Signed   By: Rolm Baptise M.D.   On: 03/13/2018 09:34   Dg Chest Port 1 View  Result Date: 03/12/2018 CLINICAL DATA:  Hypoxia EXAM: PORTABLE CHEST 1 VIEW COMPARISON:  Chest radiograph and chest CT March 11, 2018 FINDINGS: Endotracheal tube tip is 2.2 cm above the carina. Central catheter tip is in the superior vena cava near the cavoatrial junction. Nasogastric tube tip and side port are in the stomach. No pneumothorax. There is airspace consolidation in the right upper lobe and left base regions. There are small pleural effusions bilaterally. There is cardiomegaly with mild pulmonary venous hypertension. There is aortic atherosclerosis. No adenopathy. No evident bone lesions. IMPRESSION: Tube and catheter positions as described without pneumothorax. Airspace consolidation in the right upper lobe and left base regions. There are  layering pleural effusions bilaterally. There is underlying pulmonary vascular congestion. There is aortic atherosclerosis. Aortic Atherosclerosis (ICD10-I70.0). Electronically Signed   By: Lowella Grip III M.D.   On: 03/12/2018 08:04   Dg Chest Port 1 View  Result Date: 03/11/2018 CLINICAL DATA:  Intubated EXAM: PORTABLE CHEST 1 VIEW COMPARISON:  Chest radiograph from earlier today. FINDINGS: Endotracheal tube tip is 2.8 cm above the carina. Enteric tube enters stomach with the tip not seen on this image. Left subclavian central venous catheter terminates at the cavoatrial junction. Surgical clips overlie the right axilla. Stable cardiomediastinal silhouette with mild cardiomegaly. No pneumothorax. Possible small right pleural effusion, stable. No left pleural effusion. Patchy upper parahilar lung opacities bilaterally, unchanged. IMPRESSION: 1. Well-positioned support structures.  No pneumothorax. 2. Stable mild cardiomegaly with patchy upper parahilar lung opacities, favor pulmonary edema, although the differential includes multilobar pneumonia. 3. Possible stable small right pleural effusion. Electronically Signed   By: Ilona Sorrel M.D.   On: 03/11/2018 12:09   Dg Chest Port 1v Same Day  Result Date: 03/11/2018 CLINICAL DATA:  Respiratory distress. EXAM: PORTABLE CHEST 1 VIEW COMPARISON:  03/16/2018 FINDINGS: The cardiomediastinal silhouette is unchanged. Aortic atherosclerosis is noted. There is new airspace consolidation in the right upper lobe. Milder left upper lobe airspace opacity is also now present. No sizable pleural effusion or pneumothorax is identified. Surgical clips are present in the right axilla/chest wall. IMPRESSION: New right greater than left upper lobe airspace opacities concerning for pneumonia. Electronically Signed   By: Logan Bores M.D.   On: 03/11/2018 07:23   Dg Hip Unilat W Or Wo Pelvis 2-3 Views Left  Result Date: 03/11/2018 CLINICAL DATA:  Bilateral lower  extremity swelling and hip pain EXAM: DG HIP (WITH OR WITHOUT PELVIS) 2-3V LEFT COMPARISON:  None. FINDINGS: There is no evidence of hip  fracture or dislocation. There is no evidence of arthropathy or other focal bone abnormality. IMPRESSION: Negative. Electronically Signed   By: Ulyses Jarred M.D.   On: 03/11/2018 00:08    Microbiology Recent Results (from the past 240 hour(s))  Urine Culture     Status: Abnormal   Collection Time: 03/11/18  4:09 AM  Result Value Ref Range Status   Specimen Description   Final    URINE, CLEAN CATCH Performed at [facility], [place] [address], [place], [place] [address]    Special Requests   Final    NONE Performed at [facility], [place] [address] [place], [place] [address]    Culture MULTIPLE SPECIES PRESENT, SUGGEST RECOLLECTION (A)  Final   Report Status 03/12/2018 FINAL  Final  Culture, blood (routine x 2) Call MD if unable to obtain prior to antibiotics being given     Status: None   Collection Time: 03/11/18  8:38 AM  Result Value Ref Range Status   Specimen Description   Final    BLOOD LEFT ANTECUBITAL Performed at [facility] [address], [place], [place] [address]    Special Requests   Final    BOTTLES DRAWN AEROBIC AND ANAEROBIC Blood Culture adequate volume Performed at [facility] [address] [place], [place] [address]    Culture   Final    NO GROWTH 5 DAYS Performed at [facility], [place] [address], [place], [place] [address]    Report Status 03/16/2018 FINAL  Final  Culture, blood (routine x 2) Call MD if unable to obtain prior to antibiotics being given     Status: None   Collection Time: 03/11/18  8:49 AM  Result Value Ref Range Status   Specimen Description   Final    BLOOD LEFT HAND Performed at [facility] [address], [place], [place] [address]    Special Requests   Final    BOTTLES DRAWN AEROBIC ONLY Blood  Culture adequate volume Performed at [facility] [address] [place], [place] [address]    Culture   Final    NO GROWTH 5 DAYS Performed at [facility], [place] [address] [place], [place] [address]    Report Status 03/16/2018 FINAL  Final  MRSA PCR Screening     Status: None   Collection Time: 03/11/18  8:58 AM  Result Value Ref Range Status   MRSA by PCR NEGATIVE NEGATIVE Final    Comment:        The GeneXpert MRSA Assay (FDA approved for NASAL specimens only), is one component of a comprehensive MRSA colonization surveillance program. It is not intended to diagnose MRSA infection nor to guide or monitor treatment for MRSA infections. Performed at [facility], [place] [address] [place], [place] [address]     Lab Basic Metabolic Panel: Recent Labs  Lab 03/13/18 0418 03/14/18 0502 03/15/18 0527 03/16/18 0500 Apr 12, 2018 0413 04/12/18 0622  NA 139 141 145 144 145 140  K 4.0 3.6 4.3 3.3* 3.7 6.2*  CL 103 101 101 101 115* 97*  CO2 30 32 34* 34* 25 36*  GLUCOSE 161* 164* 138* 165* 117* 145*  BUN 32* 37* 39* 37* 27* 36*  CREATININE 0.87 0.72 0.87 0.74 0.42* 0.86  CALCIUM 7.8* 7.7* 8.0* 7.9* <4.0* 6.2*  MG 1.9  --  1.8 1.5* 1.0*  --   PHOS 3.0  --  2.6 1.7*  2.5  --    Liver Function Tests: No results for input(s): AST, ALT, ALKPHOS, BILITOT, PROT, ALBUMIN in the last 168 hours. No results for input(s): LIPASE, AMYLASE in the last 168 hours. No results for input(s): AMMONIA in the last 168 hours. CBC: Recent Labs  Lab 03/14/18 0502 03/15/18 0527 03/16/18 0500 April 16, 2018 0413 16-Apr-2018 0622  WBC 9.7 15.1* 11.8* 9.2 10.8*  NEUTROABS  --   --   --   --  8.4*  HGB 8.5* 9.0* 7.9* 5.9* 8.2*  HCT 27.7* 30.2* 25.7* 19.1* 25.5*  MCV 91.7 91.0 90.8 90.5 89.5  PLT 233 262 211 163 233   Cardiac Enzymes: No results for input(s): CKTOTAL, CKMB, CKMBINDEX, TROPONINI in the last 168 hours. Sepsis Labs: Recent Labs  Lab  03/15/18 0527 03/16/18 0500 04/16/18 0413 04-16-2018 0622  WBC 15.1* 11.8* 9.2 10.8*    Procedures/Operations     YACOUB,WESAM 03/20/2018, 2:01 AM

## 2018-04-17 NOTE — Accreditation Note (Signed)
o Restraints not reported to CMS Pursuant to regulation 482.13 (G) (3) use of soft wrist restraints was logged on 03/25/2018

## 2018-04-17 NOTE — Accreditation Note (Signed)
Restraints not reported to CMSPursuant to regulation 482.13 (G) (3) use of soft wrist restraints was logged on 03/25/2018

## 2018-04-17 DEATH — deceased

## 2018-05-18 DEATH — deceased

## 2019-10-07 IMAGING — DX DG CHEST 1V PORT
1 series · 1 of 1 positions shown · non-contrast
Comparison: 03/13/2018

CLINICAL DATA: Acute respiratory failure with hypoxia

EXAM:
PORTABLE CHEST 1 VIEW

[chest ap]
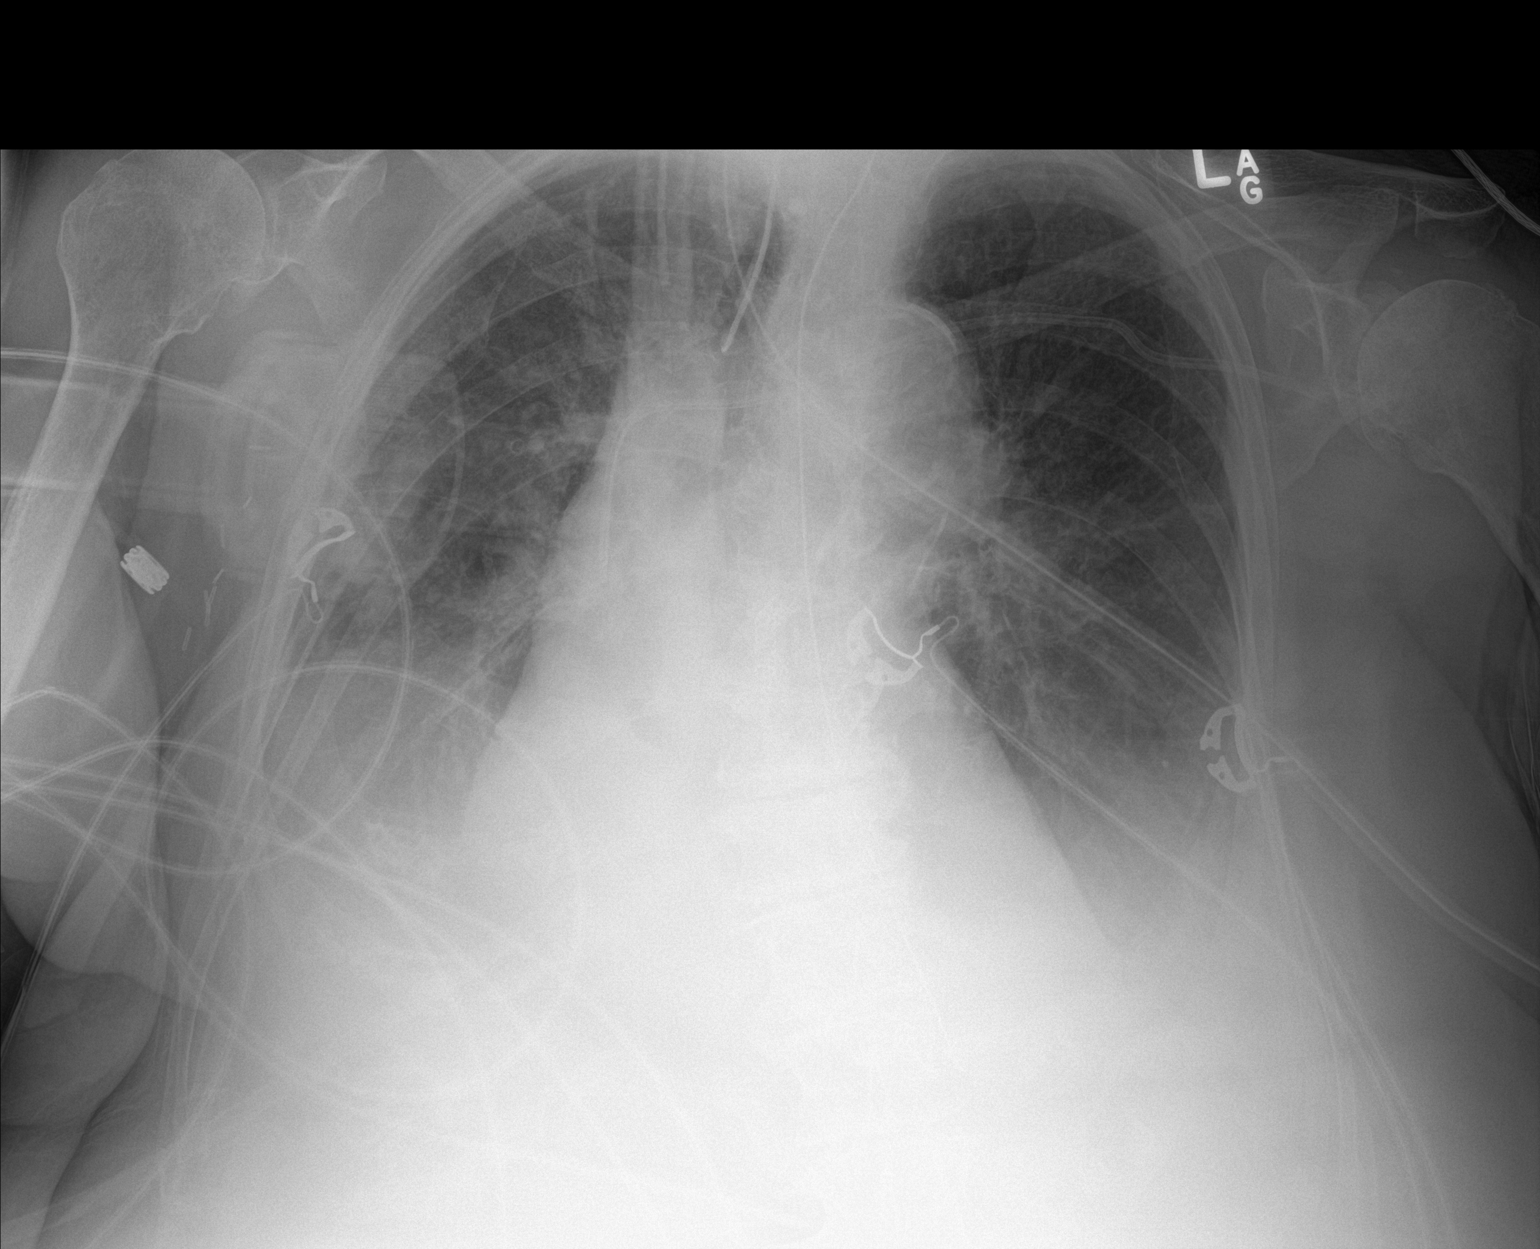

[1 of 1 positions shown; findings below may reference images not displayed]

FINDINGS: Endotracheal tube in good position. NG tube tip not well seen but
may be in the distal esophagus.

Bibasilar airspace disease and bilateral effusions unchanged.
Pulmonary vascular congestion unchanged.

Left subclavian central venous catheter tip in the SVC is unchanged.
IMPRESSION: Bibasilar airspace disease and bilateral effusions unchanged.
Pulmonary vascular congestion remains.

Endotracheal tube and central line in good position. NG tip appears
to be in the distal esophagus.

## 2019-10-08 IMAGING — DX DG CHEST 1V PORT
1 series · 1 of 1 positions shown · non-contrast
Comparison: 03/14/2018

CLINICAL DATA: Ventilator support.

EXAM:
PORTABLE CHEST 1 VIEW

[chest ap]
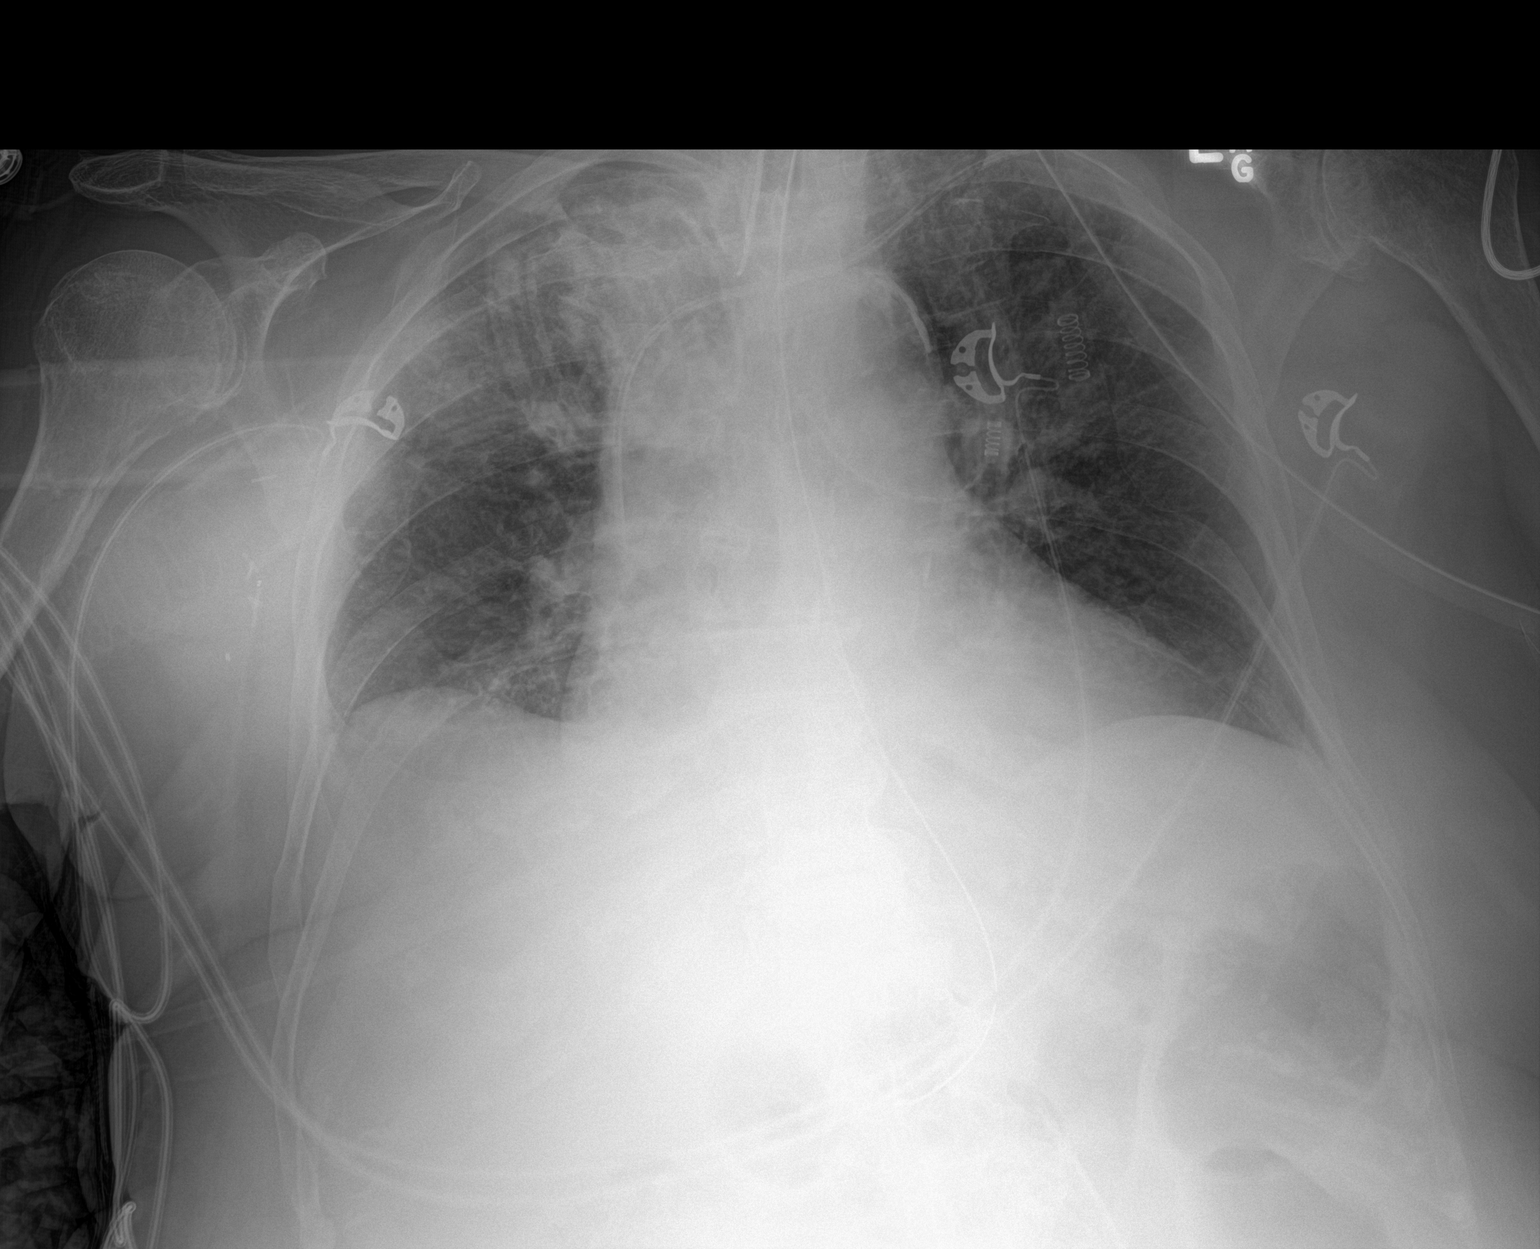

[1 of 1 positions shown; findings below may reference images not displayed]

FINDINGS: Endotracheal tube tip is 5 cm above the carina. Left subclavian
central line tip is 2 cm above the right atrium. Nasogastric tube
enters the stomach. There is improved aeration in both lower lobes.
No worsening or new finding.
IMPRESSION: Lines and tubes well position. Improved aeration in both lower
lobes.
